# Patient Record
Sex: Female | Born: 1960
Health system: Southern US, Community
[De-identification: ages and names within clinical notes are randomized; demographics above are authoritative.]

## PROBLEM LIST (undated history)

## (undated) DIAGNOSIS — F419 Anxiety disorder, unspecified: Secondary | ICD-10-CM

## (undated) DIAGNOSIS — N939 Abnormal uterine and vaginal bleeding, unspecified: Secondary | ICD-10-CM

## (undated) DIAGNOSIS — M1990 Unspecified osteoarthritis, unspecified site: Secondary | ICD-10-CM

## (undated) DIAGNOSIS — Z973 Presence of spectacles and contact lenses: Secondary | ICD-10-CM

## (undated) HISTORY — PX: COLONOSCOPY: SHX174

## (undated) HISTORY — PX: BREAST BIOPSY: SHX20

## (undated) HISTORY — DX: Anxiety disorder, unspecified: F41.9

## (undated) HISTORY — DX: Unspecified osteoarthritis, unspecified site: M19.90

---

## 1999-08-06 ENCOUNTER — Other Ambulatory Visit: Admission: RE | Admit: 1999-08-06 | Discharge: 1999-08-06 | Payer: Self-pay | Admitting: Obstetrics & Gynecology

## 2000-03-22 ENCOUNTER — Encounter: Payer: Self-pay | Admitting: Gynecology

## 2000-03-22 ENCOUNTER — Encounter: Admission: RE | Admit: 2000-03-22 | Discharge: 2000-03-22 | Payer: Self-pay | Admitting: Gynecology

## 2000-08-29 ENCOUNTER — Other Ambulatory Visit: Admission: RE | Admit: 2000-08-29 | Discharge: 2000-08-29 | Payer: Self-pay | Admitting: Obstetrics & Gynecology

## 2002-01-08 ENCOUNTER — Encounter: Admission: RE | Admit: 2002-01-08 | Discharge: 2002-01-08 | Payer: Self-pay | Admitting: Gynecology

## 2002-01-08 ENCOUNTER — Encounter: Payer: Self-pay | Admitting: Gynecology

## 2002-06-26 ENCOUNTER — Other Ambulatory Visit: Admission: RE | Admit: 2002-06-26 | Discharge: 2002-06-26 | Payer: Self-pay | Admitting: Gynecology

## 2003-01-22 ENCOUNTER — Encounter: Payer: Self-pay | Admitting: Gynecology

## 2003-01-22 ENCOUNTER — Encounter: Admission: RE | Admit: 2003-01-22 | Discharge: 2003-01-22 | Payer: Self-pay | Admitting: Gynecology

## 2003-06-27 ENCOUNTER — Encounter: Payer: Self-pay | Admitting: Gynecology

## 2003-06-27 ENCOUNTER — Encounter: Admission: RE | Admit: 2003-06-27 | Discharge: 2003-06-27 | Payer: Self-pay | Admitting: Gynecology

## 2003-07-30 ENCOUNTER — Encounter: Admission: RE | Admit: 2003-07-30 | Discharge: 2003-07-30 | Payer: Self-pay | Admitting: Gynecology

## 2003-07-30 ENCOUNTER — Encounter: Payer: Self-pay | Admitting: Gynecology

## 2003-07-30 ENCOUNTER — Encounter (INDEPENDENT_AMBULATORY_CARE_PROVIDER_SITE_OTHER): Payer: Self-pay | Admitting: *Deleted

## 2003-12-03 ENCOUNTER — Other Ambulatory Visit: Admission: RE | Admit: 2003-12-03 | Discharge: 2003-12-03 | Payer: Self-pay | Admitting: Gynecology

## 2004-06-28 ENCOUNTER — Encounter: Admission: RE | Admit: 2004-06-28 | Discharge: 2004-06-28 | Payer: Self-pay | Admitting: Gynecology

## 2005-01-26 ENCOUNTER — Other Ambulatory Visit: Admission: RE | Admit: 2005-01-26 | Discharge: 2005-01-26 | Payer: Self-pay | Admitting: Gynecology

## 2005-08-02 ENCOUNTER — Encounter: Admission: RE | Admit: 2005-08-02 | Discharge: 2005-08-02 | Payer: Self-pay | Admitting: Gynecology

## 2005-11-21 ENCOUNTER — Encounter: Admission: RE | Admit: 2005-11-21 | Discharge: 2005-11-21 | Payer: Self-pay | Admitting: Gynecology

## 2006-02-01 ENCOUNTER — Other Ambulatory Visit: Admission: RE | Admit: 2006-02-01 | Discharge: 2006-02-01 | Payer: Self-pay | Admitting: Gynecology

## 2006-08-10 ENCOUNTER — Encounter: Admission: RE | Admit: 2006-08-10 | Discharge: 2006-08-10 | Payer: Self-pay | Admitting: Gynecology

## 2007-02-26 ENCOUNTER — Other Ambulatory Visit: Admission: RE | Admit: 2007-02-26 | Discharge: 2007-02-26 | Payer: Self-pay | Admitting: Gynecology

## 2007-08-14 ENCOUNTER — Encounter: Admission: RE | Admit: 2007-08-14 | Discharge: 2007-08-14 | Payer: Self-pay | Admitting: Gynecology

## 2007-08-16 ENCOUNTER — Encounter: Admission: RE | Admit: 2007-08-16 | Discharge: 2007-08-16 | Payer: Self-pay | Admitting: Gynecology

## 2008-08-14 ENCOUNTER — Encounter: Admission: RE | Admit: 2008-08-14 | Discharge: 2008-08-14 | Payer: Self-pay | Admitting: Gynecology

## 2009-08-17 ENCOUNTER — Encounter: Admission: RE | Admit: 2009-08-17 | Discharge: 2009-08-17 | Payer: Self-pay | Admitting: Gynecology

## 2009-08-20 ENCOUNTER — Encounter: Admission: RE | Admit: 2009-08-20 | Discharge: 2009-08-20 | Payer: Self-pay | Admitting: Gynecology

## 2010-08-26 ENCOUNTER — Encounter: Admission: RE | Admit: 2010-08-26 | Discharge: 2010-08-26 | Payer: Self-pay | Admitting: Internal Medicine

## 2011-01-09 ENCOUNTER — Encounter: Payer: Self-pay | Admitting: Gynecology

## 2011-01-10 ENCOUNTER — Encounter: Payer: Self-pay | Admitting: Gynecology

## 2011-07-27 ENCOUNTER — Other Ambulatory Visit: Payer: Self-pay | Admitting: Internal Medicine

## 2011-07-27 DIAGNOSIS — Z1231 Encounter for screening mammogram for malignant neoplasm of breast: Secondary | ICD-10-CM

## 2011-08-29 ENCOUNTER — Ambulatory Visit
Admission: RE | Admit: 2011-08-29 | Discharge: 2011-08-29 | Disposition: A | Discharge status: Home / Self Care | Payer: Commercial Managed Care - PPO | Source: Ambulatory Visit | Attending: Internal Medicine | Admitting: Internal Medicine

## 2011-08-29 DIAGNOSIS — Z1231 Encounter for screening mammogram for malignant neoplasm of breast: Secondary | ICD-10-CM

## 2011-09-01 ENCOUNTER — Other Ambulatory Visit: Payer: Self-pay | Admitting: Internal Medicine

## 2011-09-01 DIAGNOSIS — R928 Other abnormal and inconclusive findings on diagnostic imaging of breast: Secondary | ICD-10-CM

## 2011-09-05 ENCOUNTER — Ambulatory Visit
Admission: RE | Admit: 2011-09-05 | Discharge: 2011-09-05 | Disposition: A | Discharge status: Home / Self Care | Payer: Commercial Managed Care - PPO | Source: Ambulatory Visit | Attending: Internal Medicine | Admitting: Internal Medicine

## 2011-09-05 DIAGNOSIS — R928 Other abnormal and inconclusive findings on diagnostic imaging of breast: Secondary | ICD-10-CM

## 2012-08-01 ENCOUNTER — Other Ambulatory Visit: Payer: Self-pay | Admitting: Internal Medicine

## 2012-08-01 DIAGNOSIS — Z1231 Encounter for screening mammogram for malignant neoplasm of breast: Secondary | ICD-10-CM

## 2012-09-03 ENCOUNTER — Ambulatory Visit
Admission: RE | Admit: 2012-09-03 | Discharge: 2012-09-03 | Disposition: A | Discharge status: Home / Self Care | Payer: Commercial Managed Care - PPO | Source: Ambulatory Visit | Attending: Internal Medicine | Admitting: Internal Medicine

## 2012-09-03 DIAGNOSIS — Z1231 Encounter for screening mammogram for malignant neoplasm of breast: Secondary | ICD-10-CM

## 2012-11-22 ENCOUNTER — Encounter: Payer: Self-pay | Admitting: Internal Medicine

## 2013-08-07 ENCOUNTER — Other Ambulatory Visit: Payer: Self-pay

## 2013-08-07 DIAGNOSIS — Z1231 Encounter for screening mammogram for malignant neoplasm of breast: Secondary | ICD-10-CM

## 2013-09-05 ENCOUNTER — Ambulatory Visit
Admission: RE | Admit: 2013-09-05 | Discharge: 2013-09-05 | Disposition: A | Discharge status: Home / Self Care | Payer: Commercial Managed Care - PPO | Source: Ambulatory Visit

## 2013-09-05 DIAGNOSIS — Z1231 Encounter for screening mammogram for malignant neoplasm of breast: Secondary | ICD-10-CM

## 2014-07-23 ENCOUNTER — Other Ambulatory Visit: Payer: Self-pay

## 2014-07-23 DIAGNOSIS — Z1231 Encounter for screening mammogram for malignant neoplasm of breast: Secondary | ICD-10-CM

## 2014-09-08 ENCOUNTER — Ambulatory Visit
Admission: RE | Admit: 2014-09-08 | Discharge: 2014-09-08 | Disposition: A | Discharge status: Home / Self Care | Payer: 59 | Source: Ambulatory Visit

## 2014-09-08 DIAGNOSIS — Z1231 Encounter for screening mammogram for malignant neoplasm of breast: Secondary | ICD-10-CM

## 2014-09-10 ENCOUNTER — Encounter (HOSPITAL_BASED_OUTPATIENT_CLINIC_OR_DEPARTMENT_OTHER): Payer: Self-pay | Admitting: *Deleted

## 2014-09-10 NOTE — Progress Notes (Signed)
NPO AFTER MN. ARRIVE AT 0600. NEEDS HG.  PRE-ORDERS PENDING.

## 2014-09-12 NOTE — H&P (Signed)
Margaret Hoffman is an 53 y.o. G 2 P 2 with postmenopausal bleeding. Ultrasound in office 17 mm.  Pertinent Gynecological History: Menses: post-menopausal Bleeding: post menopausal bleeding Contraception: none DES exposure: denies Blood transfusions: none Sexually transmitted diseases: no past history Previous GYN Procedures: none  Last mammogram: normal Date: 2015 Last pap: normal Date: 2015 OB History: G2, P2   Menstrual History: Menarche age: unknown No LMP recorded. Patient is postmenopausal.    Past Medical History  Diagnosis Date  . Endometrial polyp   . Wears contact lenses     History reviewed. No pertinent past surgical history.  History reviewed. No pertinent family history.  Social History:  reports that she has never smoked. She has never used smokeless tobacco. She reports that she drinks alcohol. She reports that she does not use illicit drugs.  Allergies:  Allergies  Allergen Reactions  . Codeine Nausea And Vomiting    No prescriptions prior to admission    ROS  Height 5' 6.75" (1.695 m), weight 62.596 kg (138 lb). Physical Exam  General alert and oriented Lung CTAB Car RRR Abdomen is soft and non tender Pelvic is WNL  Assessment/Plan: Postmenopausal bleeding Thickened Endometrium D and C, Hysteroscopy Risks reviewed with patient Consent signed  Peace Jost L 09/12/2014, 7:33 AM

## 2014-09-14 NOTE — Anesthesia Preprocedure Evaluation (Addendum)
Anesthesia Evaluation  Patient identified by MRN, date of birth, ID band Patient awake    Reviewed: Allergy & Precautions, H&P , NPO status , Patient's Chart, lab work & pertinent test results  Airway Mallampati: II TM Distance: >3 FB Neck ROM: Full    Dental no notable dental hx. (+) Teeth Intact, Dental Advisory Given   Pulmonary neg pulmonary ROS,  breath sounds clear to auscultation  Pulmonary exam normal       Cardiovascular negative cardio ROS  Rhythm:Regular Rate:Normal     Neuro/Psych negative neurological ROS  negative psych ROS   GI/Hepatic negative GI ROS, Neg liver ROS,   Endo/Other  negative endocrine ROS  Renal/GU negative Renal ROS     Musculoskeletal negative musculoskeletal ROS (+)   Abdominal   Peds  Hematology negative hematology ROS (+)   Anesthesia Other Findings   Reproductive/Obstetrics negative OB ROS                         Anesthesia Physical Anesthesia Plan  ASA: II  Anesthesia Plan: General   Post-op Pain Management:    Induction: Intravenous  Airway Management Planned: LMA  Additional Equipment:   Intra-op Plan:   Post-operative Plan: Extubation in OR  Informed Consent: I have reviewed the patients History and Physical, chart, labs and discussed the procedure including the risks, benefits and alternatives for the proposed anesthesia with the patient or authorized representative who has indicated his/her understanding and acceptance.   Dental advisory given  Plan Discussed with: CRNA  Anesthesia Plan Comments:         Anesthesia Quick Evaluation

## 2014-09-15 ENCOUNTER — Ambulatory Visit (HOSPITAL_BASED_OUTPATIENT_CLINIC_OR_DEPARTMENT_OTHER)
Admission: RE | Admit: 2014-09-15 | Discharge: 2014-09-15 | Disposition: A | Discharge status: Home / Self Care | Payer: 59 | Source: Ambulatory Visit | Attending: Obstetrics and Gynecology | Admitting: Obstetrics and Gynecology

## 2014-09-15 ENCOUNTER — Encounter (HOSPITAL_BASED_OUTPATIENT_CLINIC_OR_DEPARTMENT_OTHER)
Admission: RE | Disposition: A | Discharge status: Home / Self Care | Payer: Self-pay | Source: Ambulatory Visit | Attending: Obstetrics and Gynecology

## 2014-09-15 ENCOUNTER — Encounter (HOSPITAL_BASED_OUTPATIENT_CLINIC_OR_DEPARTMENT_OTHER): Payer: 59 | Admitting: Anesthesiology

## 2014-09-15 ENCOUNTER — Encounter (HOSPITAL_BASED_OUTPATIENT_CLINIC_OR_DEPARTMENT_OTHER): Payer: Self-pay | Admitting: *Deleted

## 2014-09-15 ENCOUNTER — Ambulatory Visit (HOSPITAL_BASED_OUTPATIENT_CLINIC_OR_DEPARTMENT_OTHER): Payer: 59 | Admitting: Anesthesiology

## 2014-09-15 DIAGNOSIS — N840 Polyp of corpus uteri: Secondary | ICD-10-CM | POA: Diagnosis not present

## 2014-09-15 DIAGNOSIS — Z885 Allergy status to narcotic agent status: Secondary | ICD-10-CM | POA: Insufficient documentation

## 2014-09-15 DIAGNOSIS — N950 Postmenopausal bleeding: Secondary | ICD-10-CM | POA: Diagnosis not present

## 2014-09-15 DIAGNOSIS — R9389 Abnormal findings on diagnostic imaging of other specified body structures: Secondary | ICD-10-CM | POA: Diagnosis not present

## 2014-09-15 HISTORY — DX: Presence of spectacles and contact lenses: Z97.3

## 2014-09-15 HISTORY — PX: HYSTEROSCOPY WITH D & C: SHX1775

## 2014-09-15 LAB — CBC
HCT: 38.4 % (ref 36.0–46.0)
HEMOGLOBIN: 13 g/dL (ref 12.0–15.0)
MCH: 28.8 pg (ref 26.0–34.0)
MCHC: 33.9 g/dL (ref 30.0–36.0)
MCV: 85 fL (ref 78.0–100.0)
Platelets: 198 10*3/uL (ref 150–400)
RBC: 4.52 MIL/uL (ref 3.87–5.11)
RDW: 12.6 % (ref 11.5–15.5)
WBC: 5 10*3/uL (ref 4.0–10.5)

## 2014-09-15 SURGERY — DILATATION AND CURETTAGE /HYSTEROSCOPY
Anesthesia: Monitor Anesthesia Care | Site: Vagina

## 2014-09-15 MED ORDER — MEPERIDINE HCL 25 MG/ML IJ SOLN
6.2500 mg | INTRAMUSCULAR | Status: DC | PRN
  Filled 2014-09-15: qty 1

## 2014-09-15 MED ORDER — CEFAZOLIN SODIUM-DEXTROSE 2-3 GM-% IV SOLR
INTRAVENOUS | Status: AC
  Filled 2014-09-15: qty 50

## 2014-09-15 MED ORDER — PROPOFOL INFUSION 10 MG/ML OPTIME
INTRAVENOUS | Status: DC | PRN
  Administered 2014-09-15: 30 mL via INTRAVENOUS

## 2014-09-15 MED ORDER — LACTATED RINGERS IV SOLN
INTRAVENOUS | Status: DC
  Administered 2014-09-15 (×2): via INTRAVENOUS
  Filled 2014-09-15: qty 1000

## 2014-09-15 MED ORDER — ONDANSETRON HCL 4 MG/2ML IJ SOLN
INTRAMUSCULAR | Status: DC | PRN
  Administered 2014-09-15: 4 mg via INTRAVENOUS

## 2014-09-15 MED ORDER — KETOROLAC TROMETHAMINE 30 MG/ML IJ SOLN
INTRAMUSCULAR | Status: DC | PRN
  Administered 2014-09-15: 30 mg via INTRAVENOUS

## 2014-09-15 MED ORDER — GLYCINE 1.5 % IR SOLN
Status: DC | PRN
  Administered 2014-09-15: 3000 mL

## 2014-09-15 MED ORDER — PROMETHAZINE HCL 25 MG/ML IJ SOLN
6.2500 mg | INTRAMUSCULAR | Status: DC | PRN
  Filled 2014-09-15: qty 1

## 2014-09-15 MED ORDER — FENTANYL CITRATE 0.05 MG/ML IJ SOLN
INTRAMUSCULAR | Status: AC
  Filled 2014-09-15: qty 2

## 2014-09-15 MED ORDER — OXYCODONE HCL 5 MG/5ML PO SOLN
5.0000 mg | Freq: Once | ORAL | Status: DC | PRN
  Filled 2014-09-15: qty 5

## 2014-09-15 MED ORDER — LIDOCAINE HCL (CARDIAC) 20 MG/ML IV SOLN
INTRAVENOUS | Status: DC | PRN
  Administered 2014-09-15: 50 mg via INTRAVENOUS

## 2014-09-15 MED ORDER — IBUPROFEN 200 MG PO TABS: 600.0000 mg | ORAL_TABLET | Freq: Four times a day (QID) | ORAL | Status: DC | PRN

## 2014-09-15 MED ORDER — HYDROMORPHONE HCL 1 MG/ML IJ SOLN
0.2500 mg | INTRAMUSCULAR | Status: DC | PRN
  Filled 2014-09-15: qty 1

## 2014-09-15 MED ORDER — PROPOFOL INFUSION 10 MG/ML OPTIME
INTRAVENOUS | Status: DC | PRN
  Administered 2014-09-15: 140 ug/kg/min via INTRAVENOUS

## 2014-09-15 MED ORDER — MIDAZOLAM HCL 5 MG/5ML IJ SOLN
INTRAMUSCULAR | Status: DC | PRN
  Administered 2014-09-15: 2 mg via INTRAVENOUS

## 2014-09-15 MED ORDER — MIDAZOLAM HCL 2 MG/2ML IJ SOLN
INTRAMUSCULAR | Status: AC
  Filled 2014-09-15: qty 2

## 2014-09-15 MED ORDER — DEXAMETHASONE SODIUM PHOSPHATE 10 MG/ML IJ SOLN
INTRAMUSCULAR | Status: DC | PRN
  Administered 2014-09-15: 10 mg via INTRAVENOUS

## 2014-09-15 MED ORDER — OXYCODONE HCL 5 MG PO TABS
5.0000 mg | ORAL_TABLET | Freq: Once | ORAL | Status: DC | PRN
Start: 2014-09-15 — End: 2014-09-15
  Filled 2014-09-15: qty 1

## 2014-09-15 MED ORDER — CEFAZOLIN SODIUM-DEXTROSE 2-3 GM-% IV SOLR
2.0000 g | INTRAVENOUS | Status: AC
  Administered 2014-09-15: 2 g via INTRAVENOUS
  Filled 2014-09-15: qty 50

## 2014-09-15 MED ORDER — LACTATED RINGERS IV SOLN
INTRAVENOUS | Status: DC
  Filled 2014-09-15: qty 1000

## 2014-09-15 MED ORDER — LIDOCAINE HCL 1 % IJ SOLN
INTRAMUSCULAR | Status: DC | PRN
  Administered 2014-09-15: 20 mL

## 2014-09-15 MED ORDER — FENTANYL CITRATE 0.05 MG/ML IJ SOLN
INTRAMUSCULAR | Status: DC | PRN
  Administered 2014-09-15: 50 ug via INTRAVENOUS

## 2014-09-15 SURGICAL SUPPLY — 24 items
CANISTER SUCTION 2500CC (MISCELLANEOUS) ×2 IMPLANT
CATH ROBINSON RED A/P 16FR (CATHETERS) ×2 IMPLANT
CLOTH BEACON ORANGE TIMEOUT ST (SAFETY) ×2 IMPLANT
COVER TABLE BACK 60X90 (DRAPES) ×2 IMPLANT
DRAPE CAMERA CLOSED 9X96 (DRAPES) ×2 IMPLANT
DRAPE HYSTEROSCOPY (DRAPE) ×2 IMPLANT
DRAPE LG THREE QUARTER DISP (DRAPES) ×2 IMPLANT
DRESSING TELFA 8X3 (GAUZE/BANDAGES/DRESSINGS) ×2 IMPLANT
ELECT REM PT RETURN 9FT ADLT (ELECTROSURGICAL)
ELECTRODE REM PT RTRN 9FT ADLT (ELECTROSURGICAL) IMPLANT
GLOVE BIO SURGEON STRL SZ 6.5 (GLOVE) ×4 IMPLANT
GLOVE BIO SURGEON STRL SZ7 (GLOVE) ×4 IMPLANT
GOWN PREVENTION PLUS LG XLONG (DISPOSABLE) ×2 IMPLANT
GOWN STRL REIN XL XLG (GOWN DISPOSABLE) IMPLANT
GOWN STRL REUS W/TWL LRG LVL3 (GOWN DISPOSABLE) ×2 IMPLANT
LEGGING LITHOTOMY PAIR STRL (DRAPES) ×2 IMPLANT
LOOP ANGLED CUTTING 22FR (CUTTING LOOP) IMPLANT
NEEDLE SPNL 22GX3.5 QUINCKE BK (NEEDLE) ×2 IMPLANT
SET TUBING HYSTEROSCOPY 2 NDL (TUBING) ×2 IMPLANT
SYR CONTROL 10ML LL (SYRINGE) ×2 IMPLANT
TOWEL OR 17X24 6PK STRL BLUE (TOWEL DISPOSABLE) ×4 IMPLANT
TRAY DSU PREP LF (CUSTOM PROCEDURE TRAY) ×2 IMPLANT
TUBE HYSTEROSCOPY W Y-CONNECT (TUBING) ×2 IMPLANT
WATER STERILE IRR 500ML POUR (IV SOLUTION) ×2 IMPLANT

## 2014-09-15 NOTE — Anesthesia Postprocedure Evaluation (Signed)
Anesthesia Post Note  Patient: Margaret Hoffman  Procedure(s) Performed: Procedure(s) (LRB): DILATATION AND CURETTAGE /HYSTEROSCOPY (N/A)  Anesthesia type: MAC  Patient location: PACU  Post pain: Pain level controlled  Post assessment: Post-op Vital signs reviewed  Last Vitals: BP 115/63  Pulse 57  Temp(Src) 36.7 C (Oral)  Resp 16  Ht 5' 6.75" (1.695 m)  Wt 140 lb 8 oz (63.73 kg)  BMI 22.18 kg/m2  SpO2 100%  Post vital signs: Reviewed  Level of consciousness: awake  Complications: No apparent anesthesia complications

## 2014-09-15 NOTE — Transfer of Care (Signed)
Immediate Anesthesia Transfer of Care Note  Patient: Margaret Hoffman  Procedure(s) Performed: Procedure(s): DILATATION AND CURETTAGE /HYSTEROSCOPY (N/A)  Patient Location: PACU  Anesthesia Type:MAC  Level of Consciousness: awake, alert , oriented and patient cooperative  Airway & Oxygen Therapy: Patient Spontanous Breathing and Patient connected to nasal cannula oxygen  Post-op Assessment: Report given to PACU RN and Post -op Vital signs reviewed and stable  Post vital signs: Reviewed and stable  Complications: No apparent anesthesia complications

## 2014-09-15 NOTE — Progress Notes (Signed)
H and P on the chart No significant changes Will proceed with D and C, Hysteroscopy Consent signed 

## 2014-09-15 NOTE — Anesthesia Procedure Notes (Signed)
Procedure Name: MAC Date/Time: 09/15/2014 7:38 AM Performed by: Wanita Chamberlain Pre-anesthesia Checklist: Patient identified, Timeout performed, Emergency Drugs available, Suction available and Patient being monitored Patient Re-evaluated:Patient Re-evaluated prior to inductionOxygen Delivery Method: Nasal cannula Intubation Type: IV induction Placement Confirmation: positive ETCO2 Dental Injury: Teeth and Oropharynx as per pre-operative assessment

## 2014-09-15 NOTE — Discharge Instructions (Signed)
D & C Home care Instructions:   Personal hygiene:  Used sanitary napkins for vaginal drainage not tampons. Shower or tub bathe the day after your procedure. No douching until bleeding stops. Always wipe from front to back after  Elimination.  Activity: Do not drive or operate any equipment today. The effects of the anesthesia are still present and drowsiness may result. Rest today, not necessarily flat bed rest, just take it easy. You may resume your normal activity in one to 2 days.  Sexual activity: No intercourse for one week or as indicated by your physician  Diet: Eat a light diet as desired this evening. You may resume a regular diet tomorrow.  Return to work: One to 2 days.  General Expectations of your surgery: Vaginal bleeding should be no heavier than a normal period. Spotting may continue up to 10 days. Mild cramps may continue for a couple of days. You may have a regular period in 2-6 weeks.  Unexpected observations call your doctor if these occur: persistent or heavy bleeding. Severe abdominal cramping or pain. Elevation of temperature greater than 100F.  Call for an appointment in one week.    Patient's Signature_______________________________________________________  Nurse's Signature________________________________________________________    Post Anesthesia Home Care Instructions  Activity: Get plenty of rest for the remainder of the day. A responsible adult should stay with you for 24 hours following the procedure.  For the next 24 hours, DO NOT: -Drive a car -Paediatric nurse -Drink alcoholic beverages -Take any medication unless instructed by your physician -Make any legal decisions or sign important papers.  Meals: Start with liquid foods such as gelatin or soup. Progress to regular foods as tolerated. Avoid greasy, spicy, heavy foods. If nausea and/or vomiting occur, drink only clear liquids until the nausea and/or vomiting subsides. Call your  physician if vomiting continues.  Special Instructions/Symptoms: Your throat may feel dry or sore from the anesthesia or the breathing tube placed in your throat during surgery. If this causes discomfort, gargle with warm salt water. The discomfort should disappear within 24 hours. Hysteroscopy, Care After Refer to this sheet in the next few weeks. These instructions provide you with information on caring for yourself after your procedure. Your health care provider may also give you more specific instructions. Your treatment has been planned according to current medical practices, but problems sometimes occur. Call your health care provider if you have any problems or questions after your procedure.  WHAT TO EXPECT AFTER THE PROCEDURE After your procedure, it is typical to have the following:  You may have some cramping. This normally lasts for a couple days.  You may have bleeding. This can vary from light spotting for a few days to menstrual-like bleeding for 3-7 days. HOME CARE INSTRUCTIONS  Rest for the first 1-2 days after the procedure.  Only take over-the-counter or prescription medicines as directed by your health care provider. Do not take aspirin. It can increase the chances of bleeding.  Take showers instead of baths for 2 weeks or as directed by your health care provider.  Do not drive for 24 hours or as directed.  Do not drink alcohol while taking pain medicine.  Do not use tampons, douche, or have sexual intercourse for 2 weeks or until your health care provider says it is okay.  Take your temperature twice a day for 4-5 days. Write it down each time.  Follow your health care provider's advice about diet, exercise, and lifting.  If you develop constipation,  you may:  Take a mild laxative if your health care provider approves.  Add bran foods to your diet.  Drink enough fluids to keep your urine clear or pale yellow.  Try to have someone with you or available to you  for the first 24-48 hours, especially if you were given a general anesthetic.  Follow up with your health care provider as directed. SEEK MEDICAL CARE IF:  You feel dizzy or lightheaded.  You feel sick to your stomach (nauseous).  You have abnormal vaginal discharge.  You have a rash.  You have pain that is not controlled with medicine. SEEK IMMEDIATE MEDICAL CARE IF:  You have bleeding that is heavier than a normal menstrual period.  You have a fever.  You have increasing cramps or pain, not controlled with medicine.  You have new belly (abdominal) pain.  You pass out.  You have pain in the tops of your shoulders (shoulder strap areas).  You have shortness of breath. Document Released: 09/25/2013 Document Reviewed: 09/25/2013 [facility] Patient Information 2015 [place], [place]. This information is not intended to replace advice given to you by your health care provider. Make sure you discuss any questions you have with your health care provider.

## 2014-09-15 NOTE — Consult Note (Deleted)
NAMEKAREY, SUTHERS                 ACCOUNT NO.:  [id number]  MEDICAL RECORD NO.:  [id number]  LOCATION:                                 FACILITY:  PHYSICIAN:  Anshu Wehner L. Laurita Peron, M.D.DATE OF BIRTH:  18-Apr-1961  DATE OF CONSULTATION:  09/15/2014 DATE OF DISCHARGE:  09/15/2014                                CONSULTATION   PREOPERATIVE DIAGNOSIS:  Postmenopausal bleeding and endometrial polyps.  POSTOPERATIVE DIAGNOSIS:  Postmenopausal bleeding and endometrial polyps.  PROCEDURE:  Dilation and curettage, hysteroscopy, and removal of endometrial polyp.  SURGEON:  Labaron Digirolamo L. Pleasant Bensinger, MD  ANESTHESIA:  Paracervical block with IV sedation.  EBL:  Minimal.  COMPLICATIONS:  None.  DRAINS:  None.  PATHOLOGY:  Uterine curettings.  DESCRIPTION OF PROCEDURE:  The patient was taken to the operating room. She was administered anesthesia.  She was prepped and draped.  Time-out was performed.  A speculum was inserted in the vagina.  The cervix was grasped with a tenaculum and a paracervical block was performed in the standard fashion.  I tried to dilate the cervix with [facility] dilators. Her cervical internal os was stenotic, so I gently inserted the diagnostic hysteroscope and was able to easily enter the uterine cavity. With excellent visualization, I could see that for the most part the uterine cavity was very atrophic.  Both tubal ostia were seen.  However, there were some polypoid areas and a cluster of polyps on the right wall of the uterus going down to the internal os.  I then removed the hysteroscope, inserted a sharp curette and curetted the uterus, and then inserted the polyp forceps a few times to remove polypoid tissue.  A final curettage was then performed.  The hysteroscope was reinserted, and the uterine cavity was cleaned.  All sponge, lap, and instrument counts were correct x2.  The patient went to recovery room in stable condition.     Manali Mcelmurry L. Helane Rima,  M.D.    Nevin Bloodgood  D:  09/15/2014  T:  09/15/2014  Job:  [id number]

## 2014-09-15 NOTE — Brief Op Note (Signed)
09/15/2014  8:10 AM  PATIENT:  Louie Bun  53 y.o. female  PRE-OPERATIVE DIAGNOSIS:  Postmenopausal bleeding, endometrial polyps  POST-OPERATIVE DIAGNOSIS:  Same  PROCEDURE:  Procedure(s): DILATATION AND CURETTAGE /HYSTEROSCOPY (N/A)  SURGEON:  Surgeon(s) and Role:    * Cyril Mourning, MD - Primary  PHYSICIAN ASSISTANT:   ASSISTANTS: none   ANESTHESIA:   IV sedation and paracervical block  EBL:  Total I/O In: -  Out: 100 [Urine:100]  BLOOD ADMINISTERED:none  DRAINS: none   LOCAL MEDICATIONS USED:  LIDOCAINE   SPECIMEN:  Source of Specimen:  uterine curretings  DISPOSITION OF SPECIMEN:  PATHOLOGY  COUNTS:  YES  TOURNIQUET:  * No tourniquets in log *  DICTATION: .Other Dictation: Dictation Number [id number]  PLAN OF CARE: Discharge to home after PACU  PATIENT DISPOSITION:  PACU - hemodynamically stable.   Delay start of Pharmacological VTE agent (>24hrs) due to surgical blood loss or risk of bleeding: not applicable

## 2014-09-16 ENCOUNTER — Encounter (HOSPITAL_BASED_OUTPATIENT_CLINIC_OR_DEPARTMENT_OTHER): Payer: Self-pay | Admitting: Obstetrics and Gynecology

## 2014-09-25 NOTE — Consult Note (Signed)
NAMEFAUSTINA, Margaret Hoffman                 ACCOUNT NO.:  [id number]  MEDICAL RECORD NO.:  [id number]  LOCATION:                                 FACILITY:  PHYSICIAN:  Corynn Solberg L. Jamarius Saha, M.D.DATE OF BIRTH:  30-Sep-1961  DATE OF PROCEDURE:  09/15/2014 DATE OF DISCHARGE:  09/15/2014                              OPERATIVE REPORT   PREOPERATIVE DIAGNOSIS:  Postmenopausal bleeding and endometrial polyps.  POSTOPERATIVE DIAGNOSIS:  Postmenopausal bleeding and endometrial polyps.  PROCEDURE:  Dilation and curettage, hysteroscopy, and removal of endometrial polyp.  SURGEON:  Shuntae Herzig L. Olon Russ, MD  ANESTHESIA:  Paracervical block with IV sedation.  EBL:  Minimal.  COMPLICATIONS:  None.  DRAINS:  None.  PATHOLOGY:  Uterine curettings.  DESCRIPTION OF PROCEDURE:  The patient was taken to the operating room. She was administered anesthesia.  She was prepped and draped.  Time-out was performed.  A speculum was inserted in the vagina.  The cervix was grasped with a tenaculum and a paracervical block was performed in the standard fashion.  I tried to dilate the cervix with [facility] dilators. Her cervical internal os was stenotic, so I gently inserted the diagnostic hysteroscope and was able to easily enter the uterine cavity. With excellent visualization, I could see that for the most part the uterine cavity was very atrophic.  Both tubal ostia were seen.  However, there were some polypoid areas and a cluster of polyps on the right wall of the uterus going down to the internal os.  I then removed the hysteroscope, inserted a sharp curette and curetted the uterus, and then inserted the polyp forceps a few times to remove polypoid tissue.  A final curettage was then performed.  The hysteroscope was reinserted, and the uterine cavity was cleaned.  All sponge, lap, and instrument counts were correct x2.  The patient went to recovery room in stable condition.     Silvanna Ohmer L. Helane Rima,  M.D.    Nevin Bloodgood  D:  09/15/2014  T:  09/15/2014  Job:  [id number]

## 2015-08-11 ENCOUNTER — Other Ambulatory Visit: Payer: Self-pay

## 2015-08-11 DIAGNOSIS — Z1231 Encounter for screening mammogram for malignant neoplasm of breast: Secondary | ICD-10-CM

## 2015-09-18 ENCOUNTER — Ambulatory Visit
Admission: RE | Admit: 2015-09-18 | Discharge: 2015-09-18 | Disposition: A | Discharge status: Home / Self Care | Payer: 59 | Source: Ambulatory Visit

## 2015-09-18 DIAGNOSIS — Z1231 Encounter for screening mammogram for malignant neoplasm of breast: Secondary | ICD-10-CM

## 2015-09-22 ENCOUNTER — Encounter: Payer: Self-pay | Admitting: *Deleted

## 2016-01-08 MED FILL — SERTRALINE HCL 50 MG TABLET: 50 | 30 days supply | Qty: 30 | Fill #1

## 2016-01-08 MED FILL — ALPRAZolam 0.5 MG TABS: 0.5 | 30 days supply | Qty: 30 | Fill #1

## 2016-01-26 DIAGNOSIS — N950 Postmenopausal bleeding: Secondary | ICD-10-CM | POA: Diagnosis not present

## 2016-01-26 DIAGNOSIS — D251 Intramural leiomyoma of uterus: Secondary | ICD-10-CM | POA: Diagnosis not present

## 2016-02-01 MED FILL — PROGESTERONE 200 MG CAPSULE: 200 | 30 days supply | Qty: 12 | Fill #4

## 2016-02-01 MED FILL — ELESTRIN 0.06% GEL: 0.52 MG/0.8 | 30 days supply | Qty: 52 | Fill #8

## 2016-02-01 MED FILL — FLUCONAZOLE 150 MG TABLET: 150 | 1 days supply | Qty: 1 | Fill #0

## 2016-02-05 ENCOUNTER — Encounter (HOSPITAL_BASED_OUTPATIENT_CLINIC_OR_DEPARTMENT_OTHER): Payer: Self-pay | Admitting: *Deleted

## 2016-02-08 ENCOUNTER — Encounter (HOSPITAL_BASED_OUTPATIENT_CLINIC_OR_DEPARTMENT_OTHER): Payer: Self-pay | Admitting: *Deleted

## 2016-02-08 NOTE — Progress Notes (Signed)
NPO AFTER MN.  ARRIVE AT 0600.  NEEDS HG.  

## 2016-02-09 MED FILL — FLUCONAZOLE 150 MG TABLET: 150 | 1 days supply | Qty: 1 | Fill #1

## 2016-02-09 MED FILL — ALPRAZolam 0.5 MG TABS: 0.5 | 30 days supply | Qty: 30 | Fill #2

## 2016-02-09 MED FILL — SERTRALINE HCL 50 MG TABLET: 50 | 30 days supply | Qty: 30 | Fill #2

## 2016-02-10 NOTE — H&P (Signed)
  55 year old G 2 P 2 with post menopausal bleeding. On HRT - Vivelle and Prometrium. Ultrasound in office - 10 mm endometrial stripe.  Past Medical History  Diagnosis Date  . Wears contact lenses   . Abnormal uterine bleeding (AUB)    Past Surgical History  Procedure Laterality Date  . Hysteroscopy w/d&c N/A 09/15/2014    Procedure: DILATATION AND CURETTAGE /HYSTEROSCOPY;  Surgeon: Cyril Mourning, MD;  Location: [facility];  Service: Gynecology;  Laterality: N/A;   endometrial polyp   Prior to Admission medications   Medication Sig Start Date End Date Taking? Authorizing Provider  ALPRAZolam Duanne Moron) 0.5 MG tablet Take 0.25 mg by mouth at bedtime as needed for anxiety.   Yes Historical Provider, MD  Estradiol (ELESTRIN) 0.52 MG/0.87 GM (0.06%) GEL Apply 2 application topically daily.   Yes Historical Provider, MD  ibuprofen (ADVIL) 200 MG tablet Take 3 tablets (600 mg total) by mouth every 6 (six) hours as needed. 09/15/14  Yes Dian Queen, MD  progesterone (PROMETRIUM) 200 MG capsule Take 200 mg by mouth as directed. Qd  Day 1 to 12 of every 3rd month-  Last dose aug 2015   Yes Historical Provider, MD  sertraline (ZOLOFT) 50 MG tablet Take 50 mg by mouth at bedtime.   Yes Historical Provider, MD   ROS : Negative  History reviewed. No pertinent family history. Social History   Social History  . Marital Status: Married    Spouse Name: N/A  . Number of Children: N/A  . Years of Education: N/A   Social History Main Topics  . Smoking status: Never Smoker   . Smokeless tobacco: Never Used  . Alcohol Use: Yes     Comment: rare  . Drug Use: No  . Sexual Activity: Not Asked   Other Topics Concern  . None   Social History Narrative   Ht 5' 6.75" (1.695 m)  Wt 63.504 kg (140 lb)  BMI 22.10 kg/m2 Car RRR Lung CTAB Car RRR Abdomen is soft and non tender Pelvic - above  IMPRESSION: Post menopausal bleeding Thickened endometrial stripe  PLAN: D and  C Hysteroscopy HTA Consent signed

## 2016-02-11 ENCOUNTER — Encounter (HOSPITAL_BASED_OUTPATIENT_CLINIC_OR_DEPARTMENT_OTHER)
Admission: RE | Disposition: A | Discharge status: Home / Self Care | Payer: Self-pay | Source: Ambulatory Visit | Attending: Obstetrics and Gynecology

## 2016-02-11 ENCOUNTER — Encounter (HOSPITAL_BASED_OUTPATIENT_CLINIC_OR_DEPARTMENT_OTHER): Payer: Self-pay | Admitting: *Deleted

## 2016-02-11 ENCOUNTER — Ambulatory Visit (HOSPITAL_BASED_OUTPATIENT_CLINIC_OR_DEPARTMENT_OTHER): Payer: 59 | Admitting: Anesthesiology

## 2016-02-11 ENCOUNTER — Ambulatory Visit (HOSPITAL_BASED_OUTPATIENT_CLINIC_OR_DEPARTMENT_OTHER)
Admission: RE | Admit: 2016-02-11 | Discharge: 2016-02-11 | Disposition: A | Discharge status: Home / Self Care | Payer: 59 | Source: Ambulatory Visit | Attending: Obstetrics and Gynecology | Admitting: Obstetrics and Gynecology

## 2016-02-11 DIAGNOSIS — N882 Stricture and stenosis of cervix uteri: Secondary | ICD-10-CM | POA: Insufficient documentation

## 2016-02-11 DIAGNOSIS — Z79899 Other long term (current) drug therapy: Secondary | ICD-10-CM | POA: Insufficient documentation

## 2016-02-11 DIAGNOSIS — N950 Postmenopausal bleeding: Secondary | ICD-10-CM | POA: Insufficient documentation

## 2016-02-11 DIAGNOSIS — N8501 Benign endometrial hyperplasia: Secondary | ICD-10-CM | POA: Insufficient documentation

## 2016-02-11 DIAGNOSIS — N8500 Endometrial hyperplasia, unspecified: Secondary | ICD-10-CM | POA: Diagnosis not present

## 2016-02-11 DIAGNOSIS — Z791 Long term (current) use of non-steroidal anti-inflammatories (NSAID): Secondary | ICD-10-CM | POA: Diagnosis not present

## 2016-02-11 DIAGNOSIS — R938 Abnormal findings on diagnostic imaging of other specified body structures: Secondary | ICD-10-CM | POA: Diagnosis not present

## 2016-02-11 HISTORY — DX: Abnormal uterine and vaginal bleeding, unspecified: N93.9

## 2016-02-11 HISTORY — PX: DILITATION & CURRETTAGE/HYSTROSCOPY WITH HYDROTHERMAL ABLATION: SHX5570

## 2016-02-11 LAB — POCT HEMOGLOBIN-HEMACUE: HEMOGLOBIN: 12.6 g/dL (ref 12.0–15.0)

## 2016-02-11 SURGERY — DILATATION & CURETTAGE/HYSTEROSCOPY WITH HYDROTHERMAL ABLATION
Anesthesia: Monitor Anesthesia Care | Site: Uterus

## 2016-02-11 MED ORDER — FENTANYL CITRATE (PF) 100 MCG/2ML IJ SOLN
INTRAMUSCULAR | Status: DC | PRN
  Administered 2016-02-11: 25 ug via INTRAVENOUS
  Administered 2016-02-11: 50 ug via INTRAVENOUS

## 2016-02-11 MED ORDER — OXYCODONE HCL 5 MG PO TABS
5.0000 mg | ORAL_TABLET | Freq: Once | ORAL | Status: DC | PRN
  Filled 2016-02-11: qty 1

## 2016-02-11 MED ORDER — KETOROLAC TROMETHAMINE 30 MG/ML IJ SOLN
INTRAMUSCULAR | Status: DC | PRN
  Administered 2016-02-11: 30 mg via INTRAVENOUS

## 2016-02-11 MED ORDER — LIDOCAINE HCL (CARDIAC) 20 MG/ML IV SOLN
INTRAVENOUS | Status: DC | PRN
  Administered 2016-02-11: 50 mg via INTRAVENOUS

## 2016-02-11 MED ORDER — CEFAZOLIN SODIUM-DEXTROSE 2-3 GM-% IV SOLR
2.0000 g | INTRAVENOUS | Status: AC
Start: 2016-02-11 — End: 2016-02-11
  Administered 2016-02-11: 2 g via INTRAVENOUS
  Filled 2016-02-11: qty 50

## 2016-02-11 MED ORDER — LIDOCAINE HCL 1 % IJ SOLN
INTRAMUSCULAR | Status: DC | PRN
  Administered 2016-02-11: 10 mL

## 2016-02-11 MED ORDER — LACTATED RINGERS IV SOLN
INTRAVENOUS | Status: DC
  Administered 2016-02-11: 07:00:00 via INTRAVENOUS
  Filled 2016-02-11: qty 1000

## 2016-02-11 MED ORDER — DEXAMETHASONE SODIUM PHOSPHATE 4 MG/ML IJ SOLN
INTRAMUSCULAR | Status: DC | PRN
  Administered 2016-02-11: 10 mg via INTRAVENOUS

## 2016-02-11 MED ORDER — SODIUM CHLORIDE 0.9 % IR SOLN
Status: DC | PRN
  Administered 2016-02-11: 3000 mL

## 2016-02-11 MED ORDER — PROPOFOL 500 MG/50ML IV EMUL
INTRAVENOUS | Status: AC
  Filled 2016-02-11: qty 50

## 2016-02-11 MED ORDER — WHITE PETROLATUM GEL
Status: AC
  Filled 2016-02-11: qty 5

## 2016-02-11 MED ORDER — PROPOFOL 500 MG/50ML IV EMUL
INTRAVENOUS | Status: DC | PRN
  Administered 2016-02-11: 100 ug/kg/min via INTRAVENOUS

## 2016-02-11 MED ORDER — ONDANSETRON HCL 4 MG/2ML IJ SOLN
4.0000 mg | Freq: Four times a day (QID) | INTRAMUSCULAR | Status: DC | PRN
  Filled 2016-02-11: qty 2

## 2016-02-11 MED ORDER — ONDANSETRON HCL 4 MG/2ML IJ SOLN
INTRAMUSCULAR | Status: DC | PRN
  Administered 2016-02-11: 4 mg via INTRAVENOUS

## 2016-02-11 MED ORDER — OXYCODONE HCL 5 MG/5ML PO SOLN
5.0000 mg | Freq: Once | ORAL | Status: DC | PRN
  Filled 2016-02-11: qty 5

## 2016-02-11 MED ORDER — FENTANYL CITRATE (PF) 100 MCG/2ML IJ SOLN
25.0000 ug | INTRAMUSCULAR | Status: DC | PRN
  Filled 2016-02-11: qty 1

## 2016-02-11 MED ORDER — PROPOFOL 10 MG/ML IV BOLUS
INTRAVENOUS | Status: DC | PRN
  Administered 2016-02-11: 30 mg via INTRAVENOUS

## 2016-02-11 MED ORDER — LACTATED RINGERS IV SOLN
INTRAVENOUS | Status: DC
  Filled 2016-02-11: qty 1000

## 2016-02-11 MED ORDER — MIDAZOLAM HCL 5 MG/5ML IJ SOLN
INTRAMUSCULAR | Status: DC | PRN
  Administered 2016-02-11: 2 mg via INTRAVENOUS

## 2016-02-11 MED ORDER — MIDAZOLAM HCL 2 MG/2ML IJ SOLN
INTRAMUSCULAR | Status: AC
  Filled 2016-02-11: qty 2

## 2016-02-11 MED ORDER — DEXAMETHASONE SODIUM PHOSPHATE 10 MG/ML IJ SOLN
INTRAMUSCULAR | Status: AC
  Filled 2016-02-11: qty 1

## 2016-02-11 MED ORDER — CEFAZOLIN SODIUM-DEXTROSE 2-3 GM-% IV SOLR
INTRAVENOUS | Status: AC
  Filled 2016-02-11: qty 50

## 2016-02-11 MED ORDER — KETOROLAC TROMETHAMINE 30 MG/ML IJ SOLN
INTRAMUSCULAR | Status: AC
  Filled 2016-02-11: qty 1

## 2016-02-11 MED ORDER — PROPOFOL 10 MG/ML IV BOLUS
INTRAVENOUS | Status: AC
Start: 2016-02-11 — End: 2016-02-11
  Filled 2016-02-11: qty 20

## 2016-02-11 MED ORDER — FENTANYL CITRATE (PF) 100 MCG/2ML IJ SOLN
INTRAMUSCULAR | Status: AC
  Filled 2016-02-11: qty 2

## 2016-02-11 MED ORDER — ONDANSETRON HCL 4 MG/2ML IJ SOLN
INTRAMUSCULAR | Status: AC
  Filled 2016-02-11: qty 2

## 2016-02-11 SURGICAL SUPPLY — 16 items
CATH ROBINSON RED A/P 16FR (CATHETERS) ×2 IMPLANT
COVER BACK TABLE 60X90IN (DRAPES) ×2 IMPLANT
DRAPE HYSTEROSCOPY (DRAPE) ×2 IMPLANT
DRAPE LG THREE QUARTER DISP (DRAPES) ×2 IMPLANT
DRSG TELFA 3X8 NADH (GAUZE/BANDAGES/DRESSINGS) ×2 IMPLANT
GLOVE BIO SURGEON STRL SZ 6.5 (GLOVE) ×6 IMPLANT
GOWN STRL REUS W/ TWL LRG LVL3 (GOWN DISPOSABLE) ×2 IMPLANT
GOWN STRL REUS W/TWL LRG LVL3 (GOWN DISPOSABLE) ×2
KIT ROOM TURNOVER WOR (KITS) ×2 IMPLANT
LEGGING LITHOTOMY PAIR STRL (DRAPES) ×2 IMPLANT
NEEDLE SPNL 25GX3.5 QUINCKE BL (NEEDLE) ×2 IMPLANT
PACK BASIN DAY SURGERY FS (CUSTOM PROCEDURE TRAY) ×2 IMPLANT
SET GENESYS HTA PROCERVA (MISCELLANEOUS) ×2 IMPLANT
SYR CONTROL 10ML LL (SYRINGE) ×2 IMPLANT
TOWEL OR 17X24 6PK STRL BLUE (TOWEL DISPOSABLE) ×4 IMPLANT
TRAY DSU PREP LF (CUSTOM PROCEDURE TRAY) ×2 IMPLANT

## 2016-02-11 NOTE — Op Note (Signed)
[name], Margaret Hoffman                 ACCOUNT NO.:  [id number]  MEDICAL RECORD NO.:  [id number]  LOCATION:                                 FACILITY:  PHYSICIAN:  [place] Helane Rima, M.D.    DATE OF BIRTH:  DATE OF PROCEDURE:  02/11/2016 DATE OF DISCHARGE:                              OPERATIVE REPORT   PREOPERATIVE DIAGNOSIS:  Postmenopausal bleeding and thickened endometrium.  POSTOPERATIVE DIAGNOSIS:  Postmenopausal bleeding, thickened endometrium, cervical stenosis, and possible uterine perforation.  SURGEON:  Elmina Hendel L. Helane Rima, M.D.  PROCEDURES:  Cervical dilation, uterine curettage, attempted diagnostic hysteroscopy.  ANESTHESIA:  MAC with paracervical block.  EBL:  Minimal.  COMPLICATIONS:  Possible uterine perforation.  PATHOLOGY:  Uterine curettings.  PROCEDURE:  The patient was taken to the operating room.  After she was consented about the risk of the procedure, she was then prepped and draped in and out catheters used to empty the bladder.  The cervix was grasped with a tenaculum.  We first started with the smallest Pratt dilator and I could see that she had severe stenosis of the internal os, so I used an os binder to dilate the cervical internal os.  It was very difficult to dilate even with the os binder.  It was then able to dilate the cervical internal os slowly, but she had severe cervical stenosis. I then inserted the smallest uterine curette and was able to thoroughly curette the tissue and all tissue appeared to be endometrial and was sent to pathology.  I then removed the curette and then inserted the hysteroscope with the HTA.  I noticed upon entry into the uterine cavity that I could not get it good distention.  We then attempted to do a fluid check and there was a rapid loss of fluid.  I believe that she had a small perforation probably from the cervical dilation, so I did a band in the procedure, and I did not feel comfortable doing the HTA.  I  do feel that I did sample the endometrium well.  The patient will be instructed about the possible uterine perforation.  All tissue was sent to pathology.  She went to recovery room in stable condition.  She will be discharged from recovery room and will be following up with me next week in the office.     Huxton Glaus L. Helane Rima, M.D.     Nevin Bloodgood  D:  02/11/2016  T:  02/11/2016  Job:  [id number]

## 2016-02-11 NOTE — Transfer of Care (Signed)
Immediate Anesthesia Transfer of Care Note  Patient: Margaret Hoffman  Procedure(s) Performed: Procedure(s): DILATATION & CURETTAGE/HYSTEROSCOPY   Patient Location: PACU  Anesthesia Type: MAC  Level of Consciousness: awake, alert , oriented and patient cooperative  Airway & Oxygen Therapy: Patient Spontanous Breathing and Patient connected to face mask oxygen  Post-op Assessment: Report given to PACU RN and Post -op Vital signs reviewed and stable  Post vital signs: Reviewed and stable  Complications: No apparent anesthesia complications

## 2016-02-11 NOTE — Progress Notes (Signed)
H and P on the chart No changes Will proceed with D and C, HYSTEROSCOPY and HTA Consent signed

## 2016-02-11 NOTE — Anesthesia Postprocedure Evaluation (Signed)
Anesthesia Post Note  Patient: Margaret Hoffman  Procedure(s) Performed: Procedure(s): DILATATION & CURETTAGE/HYSTEROSCOPY   Patient location during evaluation: PACU Anesthesia Type: MAC Level of consciousness: awake and alert Pain management: pain level controlled Vital Signs Assessment: post-procedure vital signs reviewed and stable Respiratory status: spontaneous breathing, nonlabored ventilation, respiratory function stable and patient connected to nasal cannula oxygen Cardiovascular status: stable and blood pressure returned to baseline Anesthetic complications: no    Last Vitals:  Filed Vitals:   02/11/16 0815 02/11/16 0830  BP: 94/61 102/72  Pulse: 57 64  Temp:    Resp: 10 12    Last Pain: There were no vitals filed for this visit.               [place]

## 2016-02-11 NOTE — Discharge Instructions (Signed)

## 2016-02-11 NOTE — Anesthesia Preprocedure Evaluation (Addendum)
Anesthesia Evaluation  Patient identified by MRN, date of birth, ID band Patient awake    Reviewed: Allergy & Precautions, H&P , NPO status , Patient's Chart, lab work & pertinent test results  Airway Mallampati: II   Neck ROM: full    Dental   Pulmonary neg pulmonary ROS,    breath sounds clear to auscultation       Cardiovascular negative cardio ROS   Rhythm:regular Rate:Normal     Neuro/Psych    GI/Hepatic   Endo/Other    Renal/GU      Musculoskeletal   Abdominal   Peds  Hematology   Anesthesia Other Findings   Reproductive/Obstetrics                             Anesthesia Physical Anesthesia Plan  ASA: I  Anesthesia Plan: MAC   Post-op Pain Management:    Induction: Intravenous  Airway Management Planned: Simple Face Mask  Additional Equipment:   Intra-op Plan:   Post-operative Plan:   Informed Consent: I have reviewed the patients History and Physical, chart, labs and discussed the procedure including the risks, benefits and alternatives for the proposed anesthesia with the patient or authorized representative who has indicated his/her understanding and acceptance.     Plan Discussed with: CRNA, Anesthesiologist and Surgeon  Anesthesia Plan Comments:         Anesthesia Quick Evaluation  

## 2016-02-11 NOTE — Brief Op Note (Signed)
02/11/2016  7:59 AM  PATIENT:  Margaret Hoffman  55 y.o. female  PRE-OPERATIVE DIAGNOSIS:  Postmenopausal Bleeding Thickened endometrium  POST-OPERATIVE DIAGNOSIS:  Same Cervical Stenosis Possible uterine perforation  PROCEDURE:   Cervical Dilation Uterine Curettage Attempted Hysteroscopy  SURGEON:  Surgeon(s) and Role:    * Dian Queen, MD - Primary  PHYSICIAN ASSISTANT:   ASSISTANTS: none   ANESTHESIA:   paracervical block and MAC  EBL:  Total I/O In: 200 [I.V.:200] Out: -   BLOOD ADMINISTERED:none  DRAINS: none   LOCAL MEDICATIONS USED:  LIDOCAINE   SPECIMEN:  Source of Specimen:  uterine curettings  DISPOSITION OF SPECIMEN:  Source of Specimen:  uterine currettings  COUNTS:  YES  TOURNIQUET:  * No tourniquets in log *  DICTATION: .Other Dictation: Dictation Number [id number]  PLAN OF CARE: Discharge to home after PACU  PATIENT DISPOSITION:  PACU - hemodynamically stable.   Delay start of Pharmacological VTE agent (>24hrs) due to surgical blood loss or risk of bleeding: not applicable

## 2016-02-12 ENCOUNTER — Encounter (HOSPITAL_BASED_OUTPATIENT_CLINIC_OR_DEPARTMENT_OTHER): Payer: Self-pay | Admitting: Obstetrics and Gynecology

## 2016-03-01 DIAGNOSIS — Z6821 Body mass index (BMI) 21.0-21.9, adult: Secondary | ICD-10-CM | POA: Diagnosis not present

## 2016-03-01 DIAGNOSIS — Z01419 Encounter for gynecological examination (general) (routine) without abnormal findings: Secondary | ICD-10-CM | POA: Diagnosis not present

## 2016-03-04 MED FILL — FLUCONAZOLE 150 MG TABLET: 150 | 1 days supply | Qty: 1 | Fill #2

## 2016-03-04 MED FILL — ELESTRIN 0.06% GEL: 0.52 MG/0.8 | 30 days supply | Qty: 52 | Fill #0

## 2016-03-04 MED FILL — SERTRALINE HCL 50 MG TABLET: 50 | 30 days supply | Qty: 30 | Fill #3

## 2016-03-04 MED FILL — PROGESTERONE 200 MG CAPSULE: 200 | 30 days supply | Qty: 30 | Fill #0

## 2016-03-04 MED FILL — NYSTATIN 100,000 UNITS/GM O: 100000 | 30 days supply | Qty: 30 | Fill #0

## 2016-03-04 MED FILL — IBUPROFEN 800 MG TABLET: 800 | 10 days supply | Qty: 30 | Fill #0

## 2016-03-07 MED FILL — ALPRAZolam 0.5 MG TABS: 0.5 | 30 days supply | Qty: 30 | Fill #3

## 2016-03-26 MED FILL — FLUCONAZOLE 150 MG TABLET: 150 | 1 days supply | Qty: 1 | Fill #3

## 2016-03-30 DIAGNOSIS — Z0100 Encounter for examination of eyes and vision without abnormal findings: Secondary | ICD-10-CM | POA: Diagnosis not present

## 2016-03-31 MED FILL — ELESTRIN 0.06% GEL: 0.52 MG/0.8 | 30 days supply | Qty: 52 | Fill #1

## 2016-03-31 MED FILL — SERTRALINE HCL 50 MG TABLET: 50 | 30 days supply | Qty: 30 | Fill #4

## 2016-03-31 MED FILL — PROGESTERONE 200 MG CAPSULE: 200 | 30 days supply | Qty: 30 | Fill #1

## 2016-03-31 MED FILL — NYSTATIN 100,000 UNITS/GM O: 100000 | 30 days supply | Qty: 30 | Fill #1

## 2016-04-04 MED FILL — ALPRAZolam 0.5 MG TABS: 0.5 | 30 days supply | Qty: 30 | Fill #4

## 2016-04-29 MED FILL — PROPRANOLOL 10 MG TABLET: 10 | 90 days supply | Qty: 90 | Fill #1

## 2016-04-29 MED FILL — SERTRALINE HCL 50 MG TABLET: 50 | 30 days supply | Qty: 30 | Fill #5

## 2016-04-29 MED FILL — ELESTRIN 0.06% GEL: 0.52 MG/0.8 | 30 days supply | Qty: 52 | Fill #2

## 2016-04-29 MED FILL — PROGESTERONE 200 MG CAPSULE: 200 | 30 days supply | Qty: 30 | Fill #2

## 2016-04-29 MED FILL — NYSTATIN 100,000 UNITS/GM O: 100000 | 30 days supply | Qty: 30 | Fill #2

## 2016-05-02 MED FILL — ALPRAZolam 0.5 MG TABS: 0.5 | 30 days supply | Qty: 30 | Fill #5

## 2016-05-31 MED FILL — SERTRALINE HCL 50 MG TABLET: 50 | 30 days supply | Qty: 30 | Fill #6

## 2016-05-31 MED FILL — NYSTATIN 100,000 UNITS/GM O: 100000 | 30 days supply | Qty: 30 | Fill #3

## 2016-05-31 MED FILL — ALPRAZolam 0.5 MG TABS: 0.5 | 30 days supply | Qty: 30 | Fill #0

## 2016-05-31 MED FILL — PROGESTERONE 200 MG CAPSULE: 200 | 30 days supply | Qty: 30 | Fill #3

## 2016-06-03 MED FILL — ELESTRIN 0.06% GEL: 0.52 MG/0.8 | 30 days supply | Qty: 52 | Fill #3

## 2016-06-06 MED FILL — FLUCONAZOLE 150 MG TABLET: 150 | 1 days supply | Qty: 1 | Fill #0

## 2016-06-30 MED FILL — ALPRAZolam 0.5 MG TABS: 0.5 | 30 days supply | Qty: 30 | Fill #1

## 2016-06-30 MED FILL — SERTRALINE HCL 50 MG TABLET: 50 | 30 days supply | Qty: 30 | Fill #7

## 2016-08-05 MED FILL — SERTRALINE HCL 50 MG TABLET: 50 | 30 days supply | Qty: 30 | Fill #8

## 2016-08-05 MED FILL — ALPRAZolam 0.5 MG TABS: 0.5 | 30 days supply | Qty: 30 | Fill #2

## 2016-08-08 ENCOUNTER — Other Ambulatory Visit: Payer: Self-pay | Admitting: Internal Medicine

## 2016-08-08 DIAGNOSIS — Z1231 Encounter for screening mammogram for malignant neoplasm of breast: Secondary | ICD-10-CM

## 2016-09-12 MED FILL — ALPRAZolam 0.5 MG TABS: 0.5 | 30 days supply | Qty: 30 | Fill #3

## 2016-09-12 MED FILL — SERTRALINE HCL 50 MG TABLET: 50 | 30 days supply | Qty: 30 | Fill #9

## 2016-09-20 ENCOUNTER — Ambulatory Visit
Admission: RE | Admit: 2016-09-20 | Discharge: 2016-09-20 | Disposition: A | Discharge status: Home / Self Care | Payer: BLUE CROSS/BLUE SHIELD | Source: Ambulatory Visit | Attending: Internal Medicine | Admitting: Internal Medicine

## 2016-09-20 DIAGNOSIS — Z1231 Encounter for screening mammogram for malignant neoplasm of breast: Secondary | ICD-10-CM | POA: Diagnosis not present

## 2016-09-27 DIAGNOSIS — M25562 Pain in left knee: Secondary | ICD-10-CM | POA: Diagnosis not present

## 2016-09-27 DIAGNOSIS — Z6821 Body mass index (BMI) 21.0-21.9, adult: Secondary | ICD-10-CM | POA: Diagnosis not present

## 2016-09-27 DIAGNOSIS — Z1389 Encounter for screening for other disorder: Secondary | ICD-10-CM | POA: Diagnosis not present

## 2016-09-27 DIAGNOSIS — Z0000 Encounter for general adult medical examination without abnormal findings: Secondary | ICD-10-CM | POA: Diagnosis not present

## 2016-09-27 DIAGNOSIS — E784 Other hyperlipidemia: Secondary | ICD-10-CM | POA: Diagnosis not present

## 2016-10-11 MED FILL — ALPRAZolam 0.5 MG TABS: 0.5 | 30 days supply | Qty: 30 | Fill #4

## 2016-10-11 MED FILL — SERTRALINE HCL 50 MG TABLET: 50 | 30 days supply | Qty: 30 | Fill #10

## 2016-11-08 MED FILL — ALPRAZolam 0.5 MG TABS: 0.5 | 30 days supply | Qty: 30 | Fill #5

## 2016-11-08 MED FILL — SERTRALINE HCL 50 MG TABLET: 50 | 30 days supply | Qty: 30 | Fill #11

## 2016-11-15 DIAGNOSIS — H1013 Acute atopic conjunctivitis, bilateral: Secondary | ICD-10-CM | POA: Diagnosis not present

## 2016-11-15 MED FILL — PREDNISOLONE AC 1% EYE DROP: 1 | 12 days supply | Qty: 5 | Fill #0

## 2016-11-22 DIAGNOSIS — H1013 Acute atopic conjunctivitis, bilateral: Secondary | ICD-10-CM | POA: Diagnosis not present

## 2016-11-30 DIAGNOSIS — F410 Panic disorder [episodic paroxysmal anxiety] without agoraphobia: Secondary | ICD-10-CM | POA: Diagnosis not present

## 2016-12-13 MED FILL — ALPRAZolam 0.5 MG TABS: 0.5 | 30 days supply | Qty: 30 | Fill #0

## 2016-12-13 MED FILL — SERTRALINE HCL 50 MG TABLET: 50 | 30 days supply | Qty: 30 | Fill #0

## 2017-01-11 MED FILL — ALPRAZolam 0.5 MG TABS: 0.5 | 30 days supply | Qty: 30 | Fill #1

## 2017-01-11 MED FILL — SERTRALINE HCL 50 MG TABLET: 50 | 30 days supply | Qty: 30 | Fill #1

## 2017-02-08 DIAGNOSIS — N390 Urinary tract infection, site not specified: Secondary | ICD-10-CM | POA: Diagnosis not present

## 2017-02-08 DIAGNOSIS — R102 Pelvic and perineal pain: Secondary | ICD-10-CM | POA: Diagnosis not present

## 2017-02-09 MED FILL — SERTRALINE HCL 50 MG TABLET: 50 | 30 days supply | Qty: 30 | Fill #2

## 2017-02-09 MED FILL — ALPRAZolam 0.5 MG TABS: 0.5 | 30 days supply | Qty: 30 | Fill #2

## 2017-02-21 ENCOUNTER — Other Ambulatory Visit: Payer: Self-pay | Admitting: Obstetrics and Gynecology

## 2017-02-21 DIAGNOSIS — R102 Pelvic and perineal pain: Secondary | ICD-10-CM

## 2017-02-24 ENCOUNTER — Ambulatory Visit
Admission: RE | Admit: 2017-02-24 | Discharge: 2017-02-24 | Disposition: A | Discharge status: Home / Self Care | Payer: BLUE CROSS/BLUE SHIELD | Source: Ambulatory Visit | Attending: Obstetrics and Gynecology | Admitting: Obstetrics and Gynecology

## 2017-02-24 DIAGNOSIS — R102 Pelvic and perineal pain: Secondary | ICD-10-CM

## 2017-02-24 MED ORDER — IOPAMIDOL (ISOVUE-300) INJECTION 61%
100.0000 mL | Freq: Once | INTRAVENOUS | Status: AC | PRN
  Administered 2017-02-24: 100 mL via INTRAVENOUS

## 2017-03-08 MED FILL — SERTRALINE HCL 50 MG TABLET: 50 | 30 days supply | Qty: 30 | Fill #3

## 2017-03-08 MED FILL — ALPRAZolam 0.5 MG TABS: 0.5 | 30 days supply | Qty: 30 | Fill #3

## 2017-03-14 ENCOUNTER — Ambulatory Visit: Payer: BLUE CROSS/BLUE SHIELD | Admitting: Physical Therapy

## 2017-04-10 MED FILL — ALPRAZolam 0.5 MG TABS: 0.5 | 30 days supply | Qty: 30 | Fill #4

## 2017-04-10 MED FILL — SERTRALINE HCL 50 MG TABLET: 50 | 30 days supply | Qty: 30 | Fill #4

## 2017-04-25 DIAGNOSIS — F410 Panic disorder [episodic paroxysmal anxiety] without agoraphobia: Secondary | ICD-10-CM | POA: Diagnosis not present

## 2017-05-09 MED FILL — ALPRAZolam 0.5 MG TABS: 0.5 | 30 days supply | Qty: 30 | Fill #5

## 2017-05-09 MED FILL — SERTRALINE HCL 50 MG TABLET: 50 | 30 days supply | Qty: 30 | Fill #5

## 2017-06-05 MED FILL — SERTRALINE HCL 50 MG TABLET: 50 | 22 days supply | Qty: 90 | Fill #0

## 2017-06-05 MED FILL — ALPRAZolam 0.5 MG TABS: 0.5 | 90 days supply | Qty: 90 | Fill #0

## 2017-06-29 DIAGNOSIS — H16223 Keratoconjunctivitis sicca, not specified as Sjogren's, bilateral: Secondary | ICD-10-CM | POA: Diagnosis not present

## 2017-09-06 MED FILL — ALPRAZolam 0.5 MG TABS: 0.5 | 90 days supply | Qty: 90 | Fill #1

## 2017-09-06 MED FILL — SERTRALINE HCL 50 MG TABLET: 50 | 22 days supply | Qty: 90 | Fill #1

## 2017-09-14 ENCOUNTER — Other Ambulatory Visit: Payer: Self-pay | Admitting: Obstetrics and Gynecology

## 2017-09-14 DIAGNOSIS — Z1231 Encounter for screening mammogram for malignant neoplasm of breast: Secondary | ICD-10-CM

## 2017-09-22 ENCOUNTER — Ambulatory Visit
Admission: RE | Admit: 2017-09-22 | Discharge: 2017-09-22 | Disposition: A | Discharge status: Home / Self Care | Payer: BLUE CROSS/BLUE SHIELD | Source: Ambulatory Visit | Attending: Obstetrics and Gynecology | Admitting: Obstetrics and Gynecology

## 2017-09-22 DIAGNOSIS — Z1231 Encounter for screening mammogram for malignant neoplasm of breast: Secondary | ICD-10-CM

## 2017-10-03 DIAGNOSIS — Z0000 Encounter for general adult medical examination without abnormal findings: Secondary | ICD-10-CM | POA: Diagnosis not present

## 2017-10-05 DIAGNOSIS — Z01419 Encounter for gynecological examination (general) (routine) without abnormal findings: Secondary | ICD-10-CM | POA: Diagnosis not present

## 2017-10-05 DIAGNOSIS — Z6822 Body mass index (BMI) 22.0-22.9, adult: Secondary | ICD-10-CM | POA: Diagnosis not present

## 2017-10-10 DIAGNOSIS — Z1389 Encounter for screening for other disorder: Secondary | ICD-10-CM | POA: Diagnosis not present

## 2017-10-10 DIAGNOSIS — Z6822 Body mass index (BMI) 22.0-22.9, adult: Secondary | ICD-10-CM | POA: Diagnosis not present

## 2017-10-10 DIAGNOSIS — Z0000 Encounter for general adult medical examination without abnormal findings: Secondary | ICD-10-CM | POA: Diagnosis not present

## 2017-10-23 DIAGNOSIS — F410 Panic disorder [episodic paroxysmal anxiety] without agoraphobia: Secondary | ICD-10-CM | POA: Diagnosis not present

## 2018-03-23 DIAGNOSIS — N950 Postmenopausal bleeding: Secondary | ICD-10-CM | POA: Diagnosis not present

## 2018-04-03 DIAGNOSIS — F331 Major depressive disorder, recurrent, moderate: Secondary | ICD-10-CM | POA: Diagnosis not present

## 2018-05-21 NOTE — H&P (Signed)
57 year old G 2 P 2 with postmenopausal bleeding Endometrial stripe 9.7  Mm Unable to do EMB secondary to severe cervical stenosis  Past Medical History:  Diagnosis Date  . Abnormal uterine bleeding (AUB)   . Wears contact lenses    Past Surgical History:  Procedure Laterality Date  . DILITATION & CURRETTAGE/HYSTROSCOPY WITH HYDROTHERMAL ABLATION N/A 02/11/2016   Procedure: DILATATION & CURETTAGE/HYSTEROSCOPY ;  Surgeon: Dian Queen, MD;  Location: [place];  Service: Gynecology;  Laterality: N/A;  . HYSTEROSCOPY W/D&C N/A 09/15/2014   Procedure: DILATATION AND CURETTAGE /HYSTEROSCOPY;  Surgeon: Cyril Mourning, MD;  Location: [facility];  Service: Gynecology;  Laterality: N/A;   endometrial polyp   Prior to Admission medications   Medication Sig Start Date End Date Taking? Authorizing Provider  ALPRAZolam Duanne Moron) 0.5 MG tablet Take 0.25 mg by mouth at bedtime as needed for anxiety.    [provider]  Estradiol (ELESTRIN) 0.52 MG/0.87 GM (0.06%) GEL Apply 2 application topically daily.    [provider]  progesterone (PROMETRIUM) 200 MG capsule Take 200 mg by mouth as directed. Qd  Day 1 to 12 of every 3rd month-  Last dose aug 2015    [provider]  sertraline (ZOLOFT) 50 MG tablet Take 50 mg by mouth at bedtime.    [provider]   Allergies Codeine  Family History  Problem Relation Age of Onset  . Breast cancer Neg Hx    Social History   Socioeconomic History  . Marital status: Married    Spouse name: Not on file  . Number of children: Not on file  . Years of education: Not on file  . Highest education level: Not on file  Occupational History  . Not on file  Social Needs  . Financial resource strain: Not on file  . Food insecurity:    Worry: Not on file    Inability: Not on file  . Transportation needs:    Medical: Not on file    Non-medical: Not on file  Tobacco Use  . Smoking status:  Never Smoker  . Smokeless tobacco: Never Used  Substance and Sexual Activity  . Alcohol use: Yes    Comment: rare  . Drug use: No  . Sexual activity: Not on file  Lifestyle  . Physical activity:    Days per week: Not on file    Minutes per session: Not on file  . Stress: Not on file  Relationships  . Social connections:    Talks on phone: Not on file    Gets together: Not on file    Attends religious service: Not on file    Active member of club or organization: Not on file    Attends meetings of clubs or organizations: Not on file    Relationship status: Not on file  Other Topics Concern  . Not on file  Social History Narrative  . Not on file   General alert and oriented Lung CTAB Car RRR Abdomen is soft and non tender Pelvic above  IMPRESSION: Postmenopausal bleeding Cervical Stenosis  PLAN: D and C with ultrasound guidance

## 2018-06-01 ENCOUNTER — Ambulatory Visit (HOSPITAL_COMMUNITY)
Admission: RE | Admit: 2018-06-01 | Payer: BLUE CROSS/BLUE SHIELD | Source: Ambulatory Visit | Admitting: Obstetrics and Gynecology

## 2018-06-01 SURGERY — DILATION AND CURETTAGE
Anesthesia: Choice

## 2018-07-05 NOTE — H&P (Signed)
57 year old G 2 P 2 with persistent postmenopausal bleeding.  Patient also with cervical stenosis.  Past Medical History:  Diagnosis Date  . Abnormal uterine bleeding (AUB)   . Wears contact lenses    Past Surgical History:  Procedure Laterality Date  . DILITATION & CURRETTAGE/HYSTROSCOPY WITH HYDROTHERMAL ABLATION N/A 02/11/2016   Procedure: DILATATION & CURETTAGE/HYSTEROSCOPY ;  Surgeon: Dian Queen, MD;  Location: [place];  Service: Gynecology;  Laterality: N/A;  . HYSTEROSCOPY W/D&C N/A 09/15/2014   Procedure: DILATATION AND CURETTAGE /HYSTEROSCOPY;  Surgeon: Cyril Mourning, MD;  Location: [facility];  Service: Gynecology;  Laterality: N/A;   endometrial polyp   Prior to Admission medications   Medication Sig Start Date End Date Taking? Authorizing Provider  ALPRAZolam Duanne Moron) 0.5 MG tablet Take 0.25 mg by mouth at bedtime as needed for anxiety.    [provider]  Estradiol (ELESTRIN) 0.52 MG/0.87 GM (0.06%) GEL Apply 2 application topically daily.    [provider]  progesterone (PROMETRIUM) 200 MG capsule Take 200 mg by mouth as directed. Qd  Day 1 to 12 of every 3rd month-  Last dose aug 2015    [provider]  sertraline (ZOLOFT) 50 MG tablet Take 50 mg by mouth at bedtime.    [provider]   Allergic to Codeine Family History  Problem Relation Age of Onset  . Breast cancer Neg Hx    Social History   Socioeconomic History  . Marital status: Married    Spouse name: Not on file  . Number of children: Not on file  . Years of education: Not on file  . Highest education level: Not on file  Occupational History  . Not on file  Social Needs  . Financial resource strain: Not on file  . Food insecurity:    Worry: Not on file    Inability: Not on file  . Transportation needs:    Medical: Not on file    Non-medical: Not on file  Tobacco Use  . Smoking status: Never Smoker  . Smokeless tobacco:  Never Used  Substance and Sexual Activity  . Alcohol use: Yes    Comment: rare  . Drug use: No  . Sexual activity: Not on file  Lifestyle  . Physical activity:    Days per week: Not on file    Minutes per session: Not on file  . Stress: Not on file  Relationships  . Social connections:    Talks on phone: Not on file    Gets together: Not on file    Attends religious service: Not on file    Active member of club or organization: Not on file    Attends meetings of clubs or organizations: Not on file    Relationship status: Not on file  Other Topics Concern  . Not on file  Social History Narrative  . Not on file     General alert and oriented Lung CTAB  Car RRR Abdomen is soft and non tender  Pelvic Ultrasound last stripe 9.7 mm with question of endometrial polyp  IMPRESSION: Persistent PMP Bleeding Possible endometrial Polyp Previous benign pathology  PLAN: LAVH, BS Risks reviewed Consent signed

## 2018-07-11 NOTE — Patient Instructions (Addendum)
Margaret Hoffman  07/11/2018      Your procedure is scheduled on 07-18-18   Report to [place]  at 5:30  A.M.  Call this number if you have problems the morning of surgery:[phone]  OUR ADDRESS IS [address], WE ARE LOCATED IN THE [facility]ICAL PLA[facility] WITH [facility].   Remember:  Do not eat food or drink liquids after midnight. None  Take these medicines the morning of surgery with A SIP OF WATER: None   Do not wear jewelry, make-up or nail polish.  Do not wear lotions, powders, or perfumes, or deoderant.  Do not shave 48 hours prior to surgery. .  Do not bring valuables to the hospital.  [facility] is not responsible for any belongings or valuables.  Contacts, dentures or bridgework may not be worn into surgery.  Leave your suitcase in the car.  After surgery it may be brought to your room.  For patients admitted to the hospital, discharge time will be determined by your treatment team.  Patients discharged the day of surgery will not be allowed to drive home.   Special instructions:  Please bring any medication that you might need in the original pill bottle.  Please read over the following fact sheets that you were given:       [facility] - Preparing for Surgery Before surgery, you can play an important role.  Because skin is not sterile, your skin needs to be as free of germs as possible.  You can reduce the number of germs on your skin by washing with CHG (chlorahexidine gluconate) soap before surgery.  CHG is an antiseptic cleaner which kills germs and bonds with the skin to continue killing germs even after washing. Please DO NOT use if you have an allergy to CHG or antibacterial soaps.  If your skin becomes reddened/irritated stop using the CHG and inform your nurse when you arrive at Short Stay. Do not shave (including legs and underarms) for at least 48 hours prior to the first CHG shower.  You may shave your face/neck. Please follow  these instructions carefully:  1.  Shower with CHG Soap the night before surgery and the  morning of Surgery.  2.  If you choose to wash your hair, wash your hair first as usual with your  normal  shampoo.  3.  After you shampoo, rinse your hair and body thoroughly to remove the  shampoo.                           4.  Use CHG as you would any other liquid soap.  You can apply chg directly  to the skin and wash                       Gently with a scrungie or clean washcloth.  5.  Apply the CHG Soap to your body ONLY FROM THE NECK DOWN.   Do not use on face/ open                           Wound or open sores. Avoid contact with eyes, ears mouth and genitals (private parts).                       Wash face,  Genitals (private parts) with your normal soap.  6.  Wash thoroughly, paying special attention to the area where your surgery  will be performed.  7.  Thoroughly rinse your body with warm water from the neck down.  8.  DO NOT shower/wash with your normal soap after using and rinsing off  the CHG Soap.                9.  Pat yourself dry with a clean towel.            10.  Wear clean pajamas.            11.  Place clean sheets on your bed the night of your first shower and do not  sleep with pets. Day of Surgery : Do not apply any lotions/deodorants the morning of surgery.  Please wear clean clothes to the hospital/surgery center.  FAILURE TO FOLLOW THESE INSTRUCTIONS MAY RESULT IN THE CANCELLATION OF YOUR SURGERY PATIENT SIGNATURE_________________________________  NURSE SIGNATURE__________________________________  ________________________________________________________________________

## 2018-07-12 ENCOUNTER — Encounter (HOSPITAL_COMMUNITY)
Admission: RE | Admit: 2018-07-12 | Discharge: 2018-07-12 | Disposition: A | Discharge status: Home / Self Care | Payer: BLUE CROSS/BLUE SHIELD | Source: Ambulatory Visit | Attending: Obstetrics and Gynecology | Admitting: Obstetrics and Gynecology

## 2018-07-12 ENCOUNTER — Other Ambulatory Visit: Payer: Self-pay

## 2018-07-12 ENCOUNTER — Encounter (HOSPITAL_COMMUNITY): Payer: Self-pay

## 2018-07-12 DIAGNOSIS — N882 Stricture and stenosis of cervix uteri: Secondary | ICD-10-CM

## 2018-07-12 DIAGNOSIS — Z01812 Encounter for preprocedural laboratory examination: Secondary | ICD-10-CM | POA: Insufficient documentation

## 2018-07-12 DIAGNOSIS — N950 Postmenopausal bleeding: Secondary | ICD-10-CM | POA: Diagnosis not present

## 2018-07-12 LAB — CBC
HCT: 38.1 % (ref 36.0–46.0)
Hemoglobin: 12.6 g/dL (ref 12.0–15.0)
MCH: 28.9 pg (ref 26.0–34.0)
MCHC: 33.1 g/dL (ref 30.0–36.0)
MCV: 87.4 fL (ref 78.0–100.0)
PLATELETS: 232 10*3/uL (ref 150–400)
RBC: 4.36 MIL/uL (ref 3.87–5.11)
RDW: 12.5 % (ref 11.5–15.5)
WBC: 5.7 10*3/uL (ref 4.0–10.5)

## 2018-07-12 LAB — ABO/RH: ABO/RH(D): A POS

## 2018-07-18 ENCOUNTER — Ambulatory Visit (HOSPITAL_BASED_OUTPATIENT_CLINIC_OR_DEPARTMENT_OTHER): Payer: BLUE CROSS/BLUE SHIELD | Admitting: Anesthesiology

## 2018-07-18 ENCOUNTER — Ambulatory Visit (HOSPITAL_BASED_OUTPATIENT_CLINIC_OR_DEPARTMENT_OTHER)
Admission: RE | Admit: 2018-07-18 | Discharge: 2018-07-19 | Disposition: A | Discharge status: Home / Self Care | Payer: BLUE CROSS/BLUE SHIELD | Source: Ambulatory Visit | Attending: Obstetrics and Gynecology | Admitting: Obstetrics and Gynecology

## 2018-07-18 ENCOUNTER — Encounter (HOSPITAL_BASED_OUTPATIENT_CLINIC_OR_DEPARTMENT_OTHER)
Admission: RE | Disposition: A | Discharge status: Home / Self Care | Payer: Self-pay | Source: Ambulatory Visit | Attending: Obstetrics and Gynecology

## 2018-07-18 ENCOUNTER — Encounter (HOSPITAL_BASED_OUTPATIENT_CLINIC_OR_DEPARTMENT_OTHER): Payer: Self-pay

## 2018-07-18 DIAGNOSIS — D259 Leiomyoma of uterus, unspecified: Secondary | ICD-10-CM | POA: Diagnosis not present

## 2018-07-18 DIAGNOSIS — R9389 Abnormal findings on diagnostic imaging of other specified body structures: Secondary | ICD-10-CM | POA: Diagnosis not present

## 2018-07-18 DIAGNOSIS — D282 Benign neoplasm of uterine tubes and ligaments: Secondary | ICD-10-CM | POA: Insufficient documentation

## 2018-07-18 DIAGNOSIS — N950 Postmenopausal bleeding: Secondary | ICD-10-CM | POA: Diagnosis not present

## 2018-07-18 DIAGNOSIS — Z79899 Other long term (current) drug therapy: Secondary | ICD-10-CM | POA: Diagnosis not present

## 2018-07-18 DIAGNOSIS — N8500 Endometrial hyperplasia, unspecified: Secondary | ICD-10-CM | POA: Diagnosis not present

## 2018-07-18 HISTORY — PX: LAPAROSCOPIC VAGINAL HYSTERECTOMY WITH SALPINGECTOMY: SHX6680

## 2018-07-18 LAB — TYPE AND SCREEN
ABO/RH(D): A POS
Antibody Screen: NEGATIVE

## 2018-07-18 SURGERY — HYSTERECTOMY, VAGINAL, LAPAROSCOPY-ASSISTED, WITH SALPINGECTOMY
Anesthesia: General | Site: Abdomen | Laterality: Bilateral

## 2018-07-18 MED ORDER — SODIUM CHLORIDE 0.9 % IV SOLN
INTRAVENOUS | Status: AC
  Filled 2018-07-18: qty 2

## 2018-07-18 MED ORDER — SODIUM CHLORIDE 0.9 % IV SOLN
2.0000 g | INTRAVENOUS | Status: AC
  Administered 2018-07-18: 2 g via INTRAVENOUS
  Filled 2018-07-18: qty 2

## 2018-07-18 MED ORDER — LACTATED RINGERS IV SOLN
INTRAVENOUS | Status: DC
  Administered 2018-07-18: 06:00:00 via INTRAVENOUS
  Filled 2018-07-18: qty 1000

## 2018-07-18 MED ORDER — SCOPOLAMINE 1 MG/3DAYS TD PT72
MEDICATED_PATCH | TRANSDERMAL | Status: AC
  Filled 2018-07-18: qty 1

## 2018-07-18 MED ORDER — FENTANYL CITRATE (PF) 250 MCG/5ML IJ SOLN
INTRAMUSCULAR | Status: AC
  Filled 2018-07-18: qty 5

## 2018-07-18 MED ORDER — ALPRAZOLAM 0.25 MG PO TABS
0.2500 mg | ORAL_TABLET | Freq: Every evening | ORAL | Status: DC | PRN
  Administered 2018-07-18: 0.25 mg via ORAL
  Filled 2018-07-18: qty 1

## 2018-07-18 MED ORDER — DEXAMETHASONE SODIUM PHOSPHATE 10 MG/ML IJ SOLN
INTRAMUSCULAR | Status: AC
  Filled 2018-07-18: qty 1

## 2018-07-18 MED ORDER — SERTRALINE HCL 50 MG PO TABS
50.0000 mg | ORAL_TABLET | Freq: Every day | ORAL | Status: DC
  Administered 2018-07-18: 50 mg via ORAL
  Filled 2018-07-18: qty 1

## 2018-07-18 MED ORDER — IBUPROFEN 600 MG PO TABS
600.0000 mg | ORAL_TABLET | Freq: Four times a day (QID) | ORAL | Status: DC | PRN
  Administered 2018-07-18: 600 mg via ORAL
  Filled 2018-07-18: qty 1

## 2018-07-18 MED ORDER — KETOROLAC TROMETHAMINE 30 MG/ML IJ SOLN
INTRAMUSCULAR | Status: DC | PRN
  Administered 2018-07-18: 30 mg via INTRAVENOUS

## 2018-07-18 MED ORDER — ACETAMINOPHEN 10 MG/ML IV SOLN
INTRAVENOUS | Status: AC
  Filled 2018-07-18: qty 100

## 2018-07-18 MED ORDER — MENTHOL 3 MG MT LOZG
1.0000 | LOZENGE | OROMUCOSAL | Status: DC | PRN
  Filled 2018-07-18: qty 9

## 2018-07-18 MED ORDER — DIPHENHYDRAMINE HCL 12.5 MG/5ML PO ELIX
12.5000 mg | ORAL_SOLUTION | Freq: Four times a day (QID) | ORAL | Status: DC | PRN
  Filled 2018-07-18: qty 5

## 2018-07-18 MED ORDER — ESTRADIOL 0.0375 MG/24HR TD PTWK
0.0375 mg | MEDICATED_PATCH | TRANSDERMAL | Status: DC
  Filled 2018-07-18: qty 1

## 2018-07-18 MED ORDER — BUPIVACAINE HCL (PF) 0.25 % IJ SOLN
INTRAMUSCULAR | Status: DC | PRN
  Administered 2018-07-18: 3 mL

## 2018-07-18 MED ORDER — PROMETHAZINE HCL 25 MG/ML IJ SOLN
6.2500 mg | INTRAMUSCULAR | Status: DC | PRN
  Filled 2018-07-18: qty 1

## 2018-07-18 MED ORDER — LIDOCAINE 2% (20 MG/ML) 5 ML SYRINGE
INTRAMUSCULAR | Status: DC | PRN
  Administered 2018-07-18: 60 mg via INTRAVENOUS

## 2018-07-18 MED ORDER — KETOROLAC TROMETHAMINE 30 MG/ML IJ SOLN
30.0000 mg | Freq: Once | INTRAMUSCULAR | Status: AC | PRN
  Filled 2018-07-18: qty 1

## 2018-07-18 MED ORDER — ONDANSETRON HCL 4 MG/2ML IJ SOLN
INTRAMUSCULAR | Status: AC
  Filled 2018-07-18: qty 2

## 2018-07-18 MED ORDER — LACTATED RINGERS IV SOLN
INTRAVENOUS | Status: DC
  Filled 2018-07-18: qty 1000

## 2018-07-18 MED ORDER — PROPOFOL 10 MG/ML IV BOLUS
INTRAVENOUS | Status: DC | PRN
  Administered 2018-07-18: 160 mg via INTRAVENOUS

## 2018-07-18 MED ORDER — SUGAMMADEX SODIUM 200 MG/2ML IV SOLN
INTRAVENOUS | Status: AC
  Filled 2018-07-18: qty 2

## 2018-07-18 MED ORDER — HYDROMORPHONE HCL 1 MG/ML IJ SOLN
INTRAMUSCULAR | Status: AC
  Filled 2018-07-18: qty 1

## 2018-07-18 MED ORDER — FENTANYL CITRATE (PF) 100 MCG/2ML IJ SOLN
INTRAMUSCULAR | Status: DC | PRN
  Administered 2018-07-18 (×2): 50 ug via INTRAVENOUS
  Administered 2018-07-18: 100 ug via INTRAVENOUS
  Administered 2018-07-18: 50 ug via INTRAVENOUS

## 2018-07-18 MED ORDER — DIPHENHYDRAMINE HCL 50 MG/ML IJ SOLN
12.5000 mg | Freq: Four times a day (QID) | INTRAMUSCULAR | Status: DC | PRN
  Filled 2018-07-18: qty 0.25

## 2018-07-18 MED ORDER — PROPOFOL 10 MG/ML IV BOLUS
INTRAVENOUS | Status: AC
  Filled 2018-07-18: qty 40

## 2018-07-18 MED ORDER — PHENAZOPYRIDINE HCL 200 MG PO TABS
200.0000 mg | ORAL_TABLET | Freq: Three times a day (TID) | ORAL | Status: DC
  Administered 2018-07-18: 200 mg via ORAL
  Filled 2018-07-18: qty 1

## 2018-07-18 MED ORDER — NALOXONE HCL 0.4 MG/ML IJ SOLN
0.4000 mg | INTRAMUSCULAR | Status: DC | PRN
  Filled 2018-07-18: qty 1

## 2018-07-18 MED ORDER — TRAMADOL HCL 50 MG PO TABS
50.0000 mg | ORAL_TABLET | Freq: Four times a day (QID) | ORAL | Status: DC | PRN
  Filled 2018-07-18: qty 1

## 2018-07-18 MED ORDER — MIDAZOLAM HCL 5 MG/5ML IJ SOLN
INTRAMUSCULAR | Status: DC | PRN
  Administered 2018-07-18: 2 mg via INTRAVENOUS

## 2018-07-18 MED ORDER — KETOROLAC TROMETHAMINE 30 MG/ML IJ SOLN
INTRAMUSCULAR | Status: AC
  Filled 2018-07-18: qty 1

## 2018-07-18 MED ORDER — HYDROMORPHONE HCL 1 MG/ML IJ SOLN
0.2500 mg | INTRAMUSCULAR | Status: DC | PRN
  Administered 2018-07-18 (×2): 0.25 mg via INTRAVENOUS
  Administered 2018-07-18: 0.5 mg via INTRAVENOUS
  Filled 2018-07-18: qty 0.5

## 2018-07-18 MED ORDER — ONDANSETRON HCL 4 MG/2ML IJ SOLN
4.0000 mg | Freq: Four times a day (QID) | INTRAMUSCULAR | Status: DC | PRN
  Filled 2018-07-18: qty 2

## 2018-07-18 MED ORDER — LIDOCAINE 2% (20 MG/ML) 5 ML SYRINGE
INTRAMUSCULAR | Status: AC
  Filled 2018-07-18: qty 5

## 2018-07-18 MED ORDER — SODIUM CHLORIDE 0.9% FLUSH
9.0000 mL | INTRAVENOUS | Status: DC | PRN
  Filled 2018-07-18: qty 10

## 2018-07-18 MED ORDER — SODIUM CHLORIDE 0.9 % IR SOLN
Status: DC | PRN
  Administered 2018-07-18: 3000 mL

## 2018-07-18 MED ORDER — HYDROMORPHONE 1 MG/ML IV SOLN
INTRAVENOUS | Status: DC
  Administered 2018-07-18: 0.4 mg via INTRAVENOUS
  Administered 2018-07-18: 10:00:00 via INTRAVENOUS
  Filled 2018-07-18 (×2): qty 25

## 2018-07-18 MED ORDER — IBUPROFEN 200 MG PO TABS
ORAL_TABLET | ORAL | Status: AC
Start: 2018-07-18 — End: ?
  Filled 2018-07-18: qty 3

## 2018-07-18 MED ORDER — ONDANSETRON HCL 4 MG/2ML IJ SOLN
INTRAMUSCULAR | Status: DC | PRN
  Administered 2018-07-18: 4 mg via INTRAVENOUS

## 2018-07-18 MED ORDER — ROCURONIUM BROMIDE 50 MG/5ML IV SOSY
PREFILLED_SYRINGE | INTRAVENOUS | Status: DC | PRN
  Administered 2018-07-18: 10 mg via INTRAVENOUS
  Administered 2018-07-18: 50 mg via INTRAVENOUS

## 2018-07-18 MED ORDER — DEXAMETHASONE SODIUM PHOSPHATE 10 MG/ML IJ SOLN
INTRAMUSCULAR | Status: DC | PRN
  Administered 2018-07-18: 10 mg via INTRAVENOUS

## 2018-07-18 MED ORDER — SUGAMMADEX SODIUM 200 MG/2ML IV SOLN
INTRAVENOUS | Status: DC | PRN
  Administered 2018-07-18: 150 mg via INTRAVENOUS

## 2018-07-18 MED ORDER — KETOROLAC TROMETHAMINE 30 MG/ML IJ SOLN
30.0000 mg | Freq: Once | INTRAMUSCULAR | Status: AC
  Administered 2018-07-18: 30 mg via INTRAVENOUS
  Filled 2018-07-18: qty 1

## 2018-07-18 MED ORDER — PHENAZOPYRIDINE HCL 100 MG PO TABS
ORAL_TABLET | ORAL | Status: AC
  Filled 2018-07-18: qty 2

## 2018-07-18 MED ORDER — MIDAZOLAM HCL 2 MG/2ML IJ SOLN
INTRAMUSCULAR | Status: AC
  Filled 2018-07-18: qty 2

## 2018-07-18 MED ORDER — ACETAMINOPHEN 10 MG/ML IV SOLN
INTRAVENOUS | Status: DC | PRN
  Administered 2018-07-18: 1000 mg via INTRAVENOUS

## 2018-07-18 SURGICAL SUPPLY — 51 items
APPLICATOR ARISTA FLEXITIP XL (MISCELLANEOUS) IMPLANT
BARRIER ADHS 3X4 INTERCEED (GAUZE/BANDAGES/DRESSINGS) IMPLANT
BLADE CLIPPER SURG (BLADE) IMPLANT
CANISTER SUCT 3000ML PPV (MISCELLANEOUS) IMPLANT
COVER BACK TABLE 80X110 HD (DRAPES) ×2 IMPLANT
COVER MAYO STAND STRL (DRAPES) ×4 IMPLANT
DERMABOND ADVANCED (GAUZE/BANDAGES/DRESSINGS) ×1
DERMABOND ADVANCED .7 DNX12 (GAUZE/BANDAGES/DRESSINGS) ×1 IMPLANT
DRSG COVADERM PLUS 2X2 (GAUZE/BANDAGES/DRESSINGS) IMPLANT
DRSG OPSITE POSTOP 3X4 (GAUZE/BANDAGES/DRESSINGS) ×2 IMPLANT
DURAPREP 26ML APPLICATOR (WOUND CARE) ×2 IMPLANT
ELECT REM PT RETURN 9FT ADLT (ELECTROSURGICAL) ×2
ELECTRODE REM PT RTRN 9FT ADLT (ELECTROSURGICAL) ×1 IMPLANT
GLOVE BIO SURGEON STRL SZ 6.5 (GLOVE) ×8 IMPLANT
GLOVE BIOGEL PI IND STRL 6.5 (GLOVE) ×2 IMPLANT
GLOVE BIOGEL PI IND STRL 7.5 (GLOVE) ×1 IMPLANT
GLOVE BIOGEL PI INDICATOR 6.5 (GLOVE) ×2
GLOVE BIOGEL PI INDICATOR 7.5 (GLOVE) ×1
GLOVE ECLIPSE 6.5 STRL STRAW (GLOVE) ×4 IMPLANT
GLOVE INDICATOR 7.0 STRL GRN (GLOVE) ×4 IMPLANT
HEMOSTAT ARISTA ABSORB 3G PWDR (MISCELLANEOUS) IMPLANT
HOLDER FOLEY CATH W/STRAP (MISCELLANEOUS) ×2 IMPLANT
KIT TURNOVER CYSTO (KITS) ×2 IMPLANT
NEEDLE INSUFFLATION 120MM (ENDOMECHANICALS) ×2 IMPLANT
NS IRRIG 500ML POUR BTL (IV SOLUTION) ×2 IMPLANT
PACK LAVH (CUSTOM PROCEDURE TRAY) ×2 IMPLANT
PACK ROBOTIC GOWN (GOWN DISPOSABLE) ×2 IMPLANT
PACK TRENDGUARD 450 HYBRID PRO (MISCELLANEOUS) IMPLANT
PAD OB MATERNITY 4.3X12.25 (PERSONAL CARE ITEMS) ×2 IMPLANT
PAD PREP 24X48 CUFFED NSTRL (MISCELLANEOUS) ×2 IMPLANT
SEALER TISSUE G2 CVD JAW 45CM (ENDOMECHANICALS) ×2 IMPLANT
SET IRRIG TUBING LAPAROSCOPIC (IRRIGATION / IRRIGATOR) ×2 IMPLANT
SUT VIC AB 0 CT1 18XCR BRD8 (SUTURE) ×2 IMPLANT
SUT VIC AB 0 CT1 36 (SUTURE) ×4 IMPLANT
SUT VIC AB 0 CT1 8-18 (SUTURE) ×2
SUT VIC AB 3-0 PS2 18 (SUTURE) ×1
SUT VIC AB 3-0 PS2 18XBRD (SUTURE) ×1 IMPLANT
SUT VIC AB 3-0 SH 27 (SUTURE)
SUT VIC AB 3-0 SH 27X BRD (SUTURE) IMPLANT
SUT VICRYL 0 TIES 12 18 (SUTURE) ×2 IMPLANT
SUT VICRYL 0 UR6 27IN ABS (SUTURE) ×2 IMPLANT
SYR BULB IRRIGATION 50ML (SYRINGE) IMPLANT
TOWEL OR 17X24 6PK STRL BLUE (TOWEL DISPOSABLE) ×4 IMPLANT
TRAY FOLEY W/BAG SLVR 14FR (SET/KITS/TRAYS/PACK) ×2 IMPLANT
TRENDGUARD 450 HYBRID PRO PACK (MISCELLANEOUS)
TROCAR OPTI TIP 5M 100M (ENDOMECHANICALS) ×2 IMPLANT
TROCAR XCEL BLUNT TIP 100MML (ENDOMECHANICALS) IMPLANT
TROCAR XCEL DIL TIP R 11M (ENDOMECHANICALS) ×2 IMPLANT
TUBING INSUF HEATED (TUBING) ×2 IMPLANT
WARMER LAPAROSCOPE (MISCELLANEOUS) ×2 IMPLANT
WATER STERILE IRR 500ML POUR (IV SOLUTION) ×2 IMPLANT

## 2018-07-18 NOTE — Anesthesia Postprocedure Evaluation (Signed)
Anesthesia Post Note  Patient: Margaret Hoffman  Procedure(s) Performed: LAPAROSCOPIC ASSISTED VAGINAL HYSTERECTOMY WITH SALPINGECTOMY (Bilateral Abdomen)     Patient location during evaluation: PACU Anesthesia Type: General Level of consciousness: awake and alert Pain management: pain level controlled Vital Signs Assessment: post-procedure vital signs reviewed and stable Respiratory status: spontaneous breathing, nonlabored ventilation, respiratory function stable and patient connected to nasal cannula oxygen Cardiovascular status: blood pressure returned to baseline and stable Postop Assessment: no apparent nausea or vomiting Anesthetic complications: no    Last Vitals:  Vitals:   07/18/18 1000 07/18/18 1025  BP: (!) 101/54   Pulse: (!) 56   Resp: 12 13  Temp:    SpO2: 96% 97%    Last Pain:  Vitals:   07/18/18 1025  TempSrc:   PainSc: 8                  Zadin Lange S

## 2018-07-18 NOTE — Progress Notes (Signed)
H and P on the chart No changes Will proceed with LAVH, BS Consent on chart

## 2018-07-18 NOTE — Anesthesia Procedure Notes (Signed)
Procedure Name: Intubation Date/Time: 07/18/2018 7:33 AM Performed by: Bonney Aid, CRNA Pre-anesthesia Checklist: Patient identified, Emergency Drugs available, Suction available and Patient being monitored Patient Re-evaluated:Patient Re-evaluated prior to induction Oxygen Delivery Method: Circle system utilized Preoxygenation: Pre-oxygenation with 100% oxygen Induction Type: IV induction Ventilation: Mask ventilation without difficulty Laryngoscope Size: Mac and 3 Grade View: Grade I Tube type: Oral Number of attempts: 1 Airway Equipment and Method: Stylet Placement Confirmation: ETT inserted through vocal cords under direct vision,  positive ETCO2 and breath sounds checked- equal and bilateral Secured at: 21 cm Tube secured with: Tape Dental Injury: Teeth and Oropharynx as per pre-operative assessment

## 2018-07-18 NOTE — Brief Op Note (Signed)
07/18/2018  9:04 AM  PATIENT:  Margaret Hoffman  57 y.o. female  PRE-OPERATIVE DIAGNOSIS:   Post menopausal bleeding  POST-OPERATIVE DIAGNOSIS:   Postmenopausal bleeding  PROCEDURE:  Procedure(s) with comments: LAPAROSCOPIC ASSISTED VAGINAL HYSTERECTOMY WITH SALPINGECTOMY (Bilateral) - vagina  SURGEON:  Surgeon(s) and Role:    * Dian Queen, MD - Primary    * Maisie Fus, MD - Assisting  PHYSICIAN ASSISTANT:   ASSISTANTS: none   ANESTHESIA:   general  EBL:  100 mL   BLOOD ADMINISTERED:none  DRAINS: Urinary Catheter (Foley)   LOCAL MEDICATIONS USED:  LIDOCAINE   SPECIMEN:  Source of Specimen:  uterus, cervix, and tubes  DISPOSITION OF SPECIMEN:  PATHOLOGY  COUNTS:  YES  TOURNIQUET:  * No tourniquets in log *  DICTATION: .Dragon Dictation and Other Dictation: Dictation Number dictated  PLAN OF CARE: Admit for overnight observation  PATIENT DISPOSITION:  PACU - hemodynamically stable.   Delay start of Pharmacological VTE agent (>24hrs) due to surgical blood loss or risk of bleeding: not applicable

## 2018-07-18 NOTE — Anesthesia Preprocedure Evaluation (Signed)
Anesthesia Evaluation  Patient identified by MRN, date of birth, ID band Patient awake    Reviewed: Allergy & Precautions, NPO status , Patient's Chart, lab work & pertinent test results  Airway Mallampati: II  TM Distance: >3 FB Neck ROM: Full    Dental no notable dental hx.    Pulmonary neg pulmonary ROS,    Pulmonary exam normal breath sounds clear to auscultation       Cardiovascular negative cardio ROS Normal cardiovascular exam Rhythm:Regular Rate:Normal     Neuro/Psych negative neurological ROS  negative psych ROS   GI/Hepatic negative GI ROS, Neg liver ROS,   Endo/Other  negative endocrine ROS  Renal/GU negative Renal ROS  negative genitourinary   Musculoskeletal negative musculoskeletal ROS (+)   Abdominal   Peds negative pediatric ROS (+)  Hematology negative hematology ROS (+)   Anesthesia Other Findings   Reproductive/Obstetrics negative OB ROS                             Anesthesia Physical Anesthesia Plan  ASA: I  Anesthesia Plan: General   Post-op Pain Management:    Induction: Intravenous  PONV Risk Score and Plan: 3 and Ondansetron, Dexamethasone, Midazolam, Treatment may vary due to age or medical condition and Scopolamine patch - Pre-op  Airway Management Planned: Oral ETT  Additional Equipment:   Intra-op Plan:   Post-operative Plan: Extubation in OR  Informed Consent: I have reviewed the patients History and Physical, chart, labs and discussed the procedure including the risks, benefits and alternatives for the proposed anesthesia with the patient or authorized representative who has indicated his/her understanding and acceptance.   Dental advisory given  Plan Discussed with: CRNA and Surgeon  Anesthesia Plan Comments:         Anesthesia Quick Evaluation

## 2018-07-18 NOTE — Transfer of Care (Signed)
Immediate Anesthesia Transfer of Care Note  Patient: Margaret Hoffman  Procedure(s) Performed: LAPAROSCOPIC ASSISTED VAGINAL HYSTERECTOMY WITH SALPINGECTOMY (Bilateral Abdomen)  Patient Location: PACU  Anesthesia Type:General  Level of Consciousness: drowsy and patient cooperative  Airway & Oxygen Therapy: Patient Spontanous Breathing and Patient connected to nasal cannula oxygen  Post-op Assessment: Report given to RN  Post vital signs: Reviewed and stable  Last Vitals:  Vitals Value Taken Time  BP 106/36 07/18/2018  9:10 AM  Temp 36.8 C 07/18/2018  9:10 AM  Pulse 62 07/18/2018  9:13 AM  Resp 9 07/18/2018  9:13 AM  SpO2 100 % 07/18/2018  9:13 AM  Vitals shown include unvalidated device data.  Last Pain:  Vitals:   07/18/18 0604  TempSrc:   PainSc: 0-No pain      Patients Stated Pain Goal: 5 (60/02/98 4730)  Complications: No apparent anesthesia complications

## 2018-07-18 NOTE — Progress Notes (Signed)
07/18/2018 1850 Pt. C/o severe burning sensation during urination. Dr. Helane Rima paged and made aware. Verbal order received for Pyridium 200 mg PO TID. Orders placed. Will continue to closely monitor patient.  Etherine Mackowiak, Arville Lime

## 2018-07-19 DIAGNOSIS — Z79899 Other long term (current) drug therapy: Secondary | ICD-10-CM | POA: Diagnosis not present

## 2018-07-19 DIAGNOSIS — D282 Benign neoplasm of uterine tubes and ligaments: Secondary | ICD-10-CM | POA: Diagnosis not present

## 2018-07-19 DIAGNOSIS — D259 Leiomyoma of uterus, unspecified: Secondary | ICD-10-CM | POA: Diagnosis not present

## 2018-07-19 DIAGNOSIS — N950 Postmenopausal bleeding: Secondary | ICD-10-CM | POA: Diagnosis not present

## 2018-07-19 LAB — CBC
HCT: 32.6 % — ABNORMAL LOW (ref 36.0–46.0)
HEMOGLOBIN: 10.9 g/dL — AB (ref 12.0–15.0)
MCH: 29.5 pg (ref 26.0–34.0)
MCHC: 33.4 g/dL (ref 30.0–36.0)
MCV: 88.1 fL (ref 78.0–100.0)
PLATELETS: 183 10*3/uL (ref 150–400)
RBC: 3.7 MIL/uL — AB (ref 3.87–5.11)
RDW: 12.6 % (ref 11.5–15.5)
WBC: 9.4 10*3/uL (ref 4.0–10.5)

## 2018-07-19 MED ORDER — PHENAZOPYRIDINE HCL 100 MG PO TABS
ORAL_TABLET | ORAL | Status: AC
  Filled 2018-07-19: qty 2

## 2018-07-19 MED ORDER — PHENAZOPYRIDINE HCL 200 MG PO TABS: 200.0000 mg | ORAL_TABLET | Freq: Three times a day (TID) | ORAL | 0 refills | Status: DC

## 2018-07-19 MED ORDER — IBUPROFEN 600 MG PO TABS: 600.0000 mg | ORAL_TABLET | Freq: Four times a day (QID) | ORAL | 0 refills | Status: DC | PRN

## 2018-07-19 NOTE — Discharge Summary (Signed)
Admission Diagnosis: Postmenopausal bleeding  Discharge Diagnosis: Same  Hospital Course: 57 year old female admitted for LAVH and BS. She did very well post op .  By POD # 1 she was ambulating, voiding, and tolerating regular diet. She was only taking Ibuprofen.  BP (!) 98/51 (BP Location: Right Arm)   Pulse 66   Temp 98.6 F (37 C)   Resp 16   Ht 5\' 7"  (1.702 m)   Wt 63 kg (138 lb 14.4 oz)   LMP 12/25/2012   SpO2 96%   BMI 21.75 kg/m  Abdomen is soft and non tender  No vaginal bleeding  Results for orders placed or performed during the hospital encounter of 07/18/18 (from the past 24 hour(s))  CBC     Status: Abnormal   Collection Time: 07/19/18  5:17 AM  Result Value Ref Range   WBC 9.4 4.0 - 10.5 K/uL   RBC 3.70 (L) 3.87 - 5.11 MIL/uL   Hemoglobin 10.9 (L) 12.0 - 15.0 g/dL   HCT 32.6 (L) 36.0 - 46.0 %   MCV 88.1 78.0 - 100.0 fL   MCH 29.5 26.0 - 34.0 pg   MCHC 33.4 30.0 - 36.0 g/dL   RDW 12.6 11.5 - 15.5 %   Platelets 183 150 - 400 K/uL   Patient discharged home with pyridium and ibuprofen Follow up in 1 week

## 2018-07-19 NOTE — Progress Notes (Signed)
Wasted 22mg  IV Dilaudid in sink from unused PCA. Witnessed by K.Hill Therapist, sports.  Blair Hailey, RN

## 2018-07-19 NOTE — Op Note (Signed)
NAMEKALAYA, INFANTINO MEDICAL RECORD [id number] ACCOUNT [id number] DATE OF BIRTH:Apr 07, 1961 FACILITY: [facility] LOCATION: [facility] PHYSICIAN:Jadah Bobak Lynett Fish, MD  OPERATIVE REPORT  DATE OF PROCEDURE:  07/18/2018  PREOPERATIVE DIAGNOSES:  Postmenopausal bleeding, thickened endometrium, history of endometrial polyp and cervical stenosis.  POSTOPERATIVE DIAGNOSES:  Postmenopausal bleeding, thickened endometrium, history of endometrial polyp and cervical stenosis.  PROCEDURE:  Laparoscopic assisted vaginal hysterectomy with bilateral salpingectomy.  SURGEON:  Dian Queen, MD  ASSISTANT:  Dr. Milta Deiters   ANESTHESIA:  General.  ESTIMATED BLOOD LOSS:  100 mL.  CATHETERS:  Foley.  PATHOLOGY:  Uterus, cervix, fallopian tubes sent to Pathology.  COMPLICATIONS:  None.  DESCRIPTION OF PROCEDURE:  The patient was taken to the operating room.  She was intubated.  She was prepped and draped in sterile fashion.  Uterine manipulator was inserted and a Foley catheter was inserted and draining clear urine.  Attention was  turned to the abdomen where a small infraumbilical incision was made.  The Veress needle was inserted.  Pneumoperitoneum was performed.  The Veress needle was removed and an 11 mm trocar was inserted.  The patient was gently placed in Trendelenburg  position and a 5 mm trocar was inserted under direct visualization.  Exam of the abdomen and pelvis revealed that the ovaries appeared unremarkable.  I used a grasper and elevated the right fallopian tube placed the EnSeal across the mesosalpinx and then  cauterized and cut and carried down to the round ligament.  We then cauterized and cut the triple pedicle on the right side.  This was done with excellent hemostasis.  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  On the left side of the pelvis, the left fallopian tube was elevated and EnSeal was placed across the  mesosalpinx and carried down to the round ligament and this was done on the right side as well.  We then placed a weighted  speculum in the vagina after pneumoperitoneum was released.  Cervix was grasped with a tenaculum.  A circumferential incision was made around the cervix.  The posterior cul-de-sac was entered using curved Mayo scissors and the anterior cul-de-sac was  entered using Mayo scissors.  The uterosacral cardinal ligaments were then clamped on either side using curved Heaney clamps.  Each pedicle was clamped and suture ligated using 0 Vicryl suture.  We carefully walked the way up the broad ligament were  careful attention to avoid the surrounding tissue and staying snug beside the cervix and uterus.  Each pedicle was clamped, cut and suture ligated using 0 Vicryl suture.  The uterus was retroflexed and the remainder of the broad ligament was clamped on  either side and the specimen was removed.  The pedicles were secured using 0 Vicryl suture.  The posterior cuff was closed using 0 Vicryl suture in a running locked stitch.  The remainder of the cuff was then closed anterior to posterior in a running  locked stitch using 0 Vicryl suture.  We then replaced the pneumoperitoneum, irrigated the pelvis.  Hemostasis was very good.  The pneumoperitoneum was released.  The trocars were removed.  Incisions were closed using Dermabond.  All sponge, lap and  instrument counts were correct x2.  Urine output was clear.    She was extubated and went to recovery room in stable condition.  AN/NUANCE  D:07/19/2018 T:07/19/2018 JOB:[id number]

## 2018-07-20 ENCOUNTER — Encounter (HOSPITAL_BASED_OUTPATIENT_CLINIC_OR_DEPARTMENT_OTHER): Payer: Self-pay | Admitting: Obstetrics and Gynecology

## 2018-08-08 DIAGNOSIS — H10413 Chronic giant papillary conjunctivitis, bilateral: Secondary | ICD-10-CM | POA: Diagnosis not present

## 2018-08-21 DIAGNOSIS — Z1329 Encounter for screening for other suspected endocrine disorder: Secondary | ICD-10-CM | POA: Diagnosis not present

## 2018-08-21 DIAGNOSIS — Z4889 Encounter for other specified surgical aftercare: Secondary | ICD-10-CM | POA: Diagnosis not present

## 2018-08-21 DIAGNOSIS — R5383 Other fatigue: Secondary | ICD-10-CM | POA: Diagnosis not present

## 2018-08-21 DIAGNOSIS — Z13220 Encounter for screening for lipoid disorders: Secondary | ICD-10-CM | POA: Diagnosis not present

## 2018-08-21 DIAGNOSIS — Z131 Encounter for screening for diabetes mellitus: Secondary | ICD-10-CM | POA: Diagnosis not present

## 2018-08-21 DIAGNOSIS — E785 Hyperlipidemia, unspecified: Secondary | ICD-10-CM | POA: Diagnosis not present

## 2018-08-21 DIAGNOSIS — N950 Postmenopausal bleeding: Secondary | ICD-10-CM | POA: Diagnosis not present

## 2018-08-28 ENCOUNTER — Other Ambulatory Visit: Payer: Self-pay | Admitting: Obstetrics and Gynecology

## 2018-08-28 DIAGNOSIS — Z1231 Encounter for screening mammogram for malignant neoplasm of breast: Secondary | ICD-10-CM

## 2018-09-04 ENCOUNTER — Telehealth: Payer: Self-pay | Admitting: Internal Medicine

## 2018-09-04 NOTE — Telephone Encounter (Signed)
Hi Dr. [name], we have received a referral from pt's PCP for a repeat colonoscopy. Pt had colon in 2013 with Dr. Earlean Shawl and is requesting to see you for her procedure.. Colon report and path will be placed on your desk for review. Pt wants to know if she could wait longer for procedure or if she could have a less invasive exam like cologuard. Please advise. Thank you.

## 2018-10-01 NOTE — Telephone Encounter (Signed)
Dr. [name] reviewed patient's records and okay to schedule direct colon.  Called and spoke w/patient and she said she will call back to schedule.  Records placed in the "records reviewed" folder

## 2018-10-02 ENCOUNTER — Ambulatory Visit
Admission: RE | Admit: 2018-10-02 | Discharge: 2018-10-02 | Disposition: A | Discharge status: Home / Self Care | Payer: BLUE CROSS/BLUE SHIELD | Source: Ambulatory Visit | Attending: Obstetrics and Gynecology | Admitting: Obstetrics and Gynecology

## 2018-10-02 DIAGNOSIS — Z1231 Encounter for screening mammogram for malignant neoplasm of breast: Secondary | ICD-10-CM

## 2018-10-02 DIAGNOSIS — F331 Major depressive disorder, recurrent, moderate: Secondary | ICD-10-CM | POA: Diagnosis not present

## 2018-10-02 DIAGNOSIS — F411 Generalized anxiety disorder: Secondary | ICD-10-CM | POA: Diagnosis not present

## 2018-10-08 DIAGNOSIS — S0501XA Injury of conjunctiva and corneal abrasion without foreign body, right eye, initial encounter: Secondary | ICD-10-CM | POA: Diagnosis not present

## 2018-10-15 ENCOUNTER — Encounter: Payer: Self-pay | Admitting: Internal Medicine

## 2018-10-26 DIAGNOSIS — Z0000 Encounter for general adult medical examination without abnormal findings: Secondary | ICD-10-CM | POA: Diagnosis not present

## 2018-10-26 DIAGNOSIS — Z6821 Body mass index (BMI) 21.0-21.9, adult: Secondary | ICD-10-CM | POA: Diagnosis not present

## 2018-10-26 DIAGNOSIS — Z23 Encounter for immunization: Secondary | ICD-10-CM | POA: Diagnosis not present

## 2018-11-09 ENCOUNTER — Encounter: Payer: Self-pay | Admitting: Internal Medicine

## 2018-11-09 ENCOUNTER — Ambulatory Visit (AMBULATORY_SURGERY_CENTER): Payer: Self-pay

## 2018-11-09 VITALS — Ht 67.0 in | Wt 137.4 lb

## 2018-11-09 DIAGNOSIS — Z86010 Personal history of colonic polyps: Secondary | ICD-10-CM

## 2018-11-09 MED ORDER — NA SULFATE-K SULFATE-MG SULF 17.5-3.13-1.6 GM/177ML PO SOLN: 1.0000 | Freq: Once | ORAL | 0 refills | Status: AC

## 2018-11-09 NOTE — Progress Notes (Signed)
Per pt, no allergies to soy or egg products.Pt not taking any weight loss meds or using  O2 at home.  Pt refused emmi video. 

## 2018-11-20 ENCOUNTER — Encounter: Payer: Self-pay | Admitting: Internal Medicine

## 2018-11-20 ENCOUNTER — Ambulatory Visit (AMBULATORY_SURGERY_CENTER): Payer: BLUE CROSS/BLUE SHIELD | Admitting: Internal Medicine

## 2018-11-20 VITALS — BP 111/60 | HR 58 | Temp 97.8°F | Resp 12 | Ht 67.0 in | Wt 137.0 lb

## 2018-11-20 DIAGNOSIS — Z86010 Personal history of colonic polyps: Secondary | ICD-10-CM

## 2018-11-20 DIAGNOSIS — D123 Benign neoplasm of transverse colon: Secondary | ICD-10-CM

## 2018-11-20 DIAGNOSIS — D122 Benign neoplasm of ascending colon: Secondary | ICD-10-CM | POA: Diagnosis not present

## 2018-11-20 DIAGNOSIS — K635 Polyp of colon: Secondary | ICD-10-CM | POA: Diagnosis not present

## 2018-11-20 DIAGNOSIS — D120 Benign neoplasm of cecum: Secondary | ICD-10-CM

## 2018-11-20 MED ORDER — SODIUM CHLORIDE 0.9 % IV SOLN: 500.0000 mL | Freq: Once | INTRAVENOUS | Status: DC

## 2018-11-20 NOTE — Patient Instructions (Signed)
Handouts provided:  Polyps  YOU HAD AN ENDOSCOPIC PROCEDURE TODAY AT THE [facility]:   Refer to the procedure report that was given to you for any specific questions about what was found during the examination.  If the procedure report does not answer your questions, please call your gastroenterologist to clarify.  If you requested that your care partner not be given the details of your procedure findings, then the procedure report has been included in a sealed envelope for you to review at your convenience later.  YOU SHOULD EXPECT: Some feelings of bloating in the abdomen. Passage of more gas than usual.  Walking can help get rid of the air that was put into your GI tract during the procedure and reduce the bloating. If you had a lower endoscopy (such as a colonoscopy or flexible sigmoidoscopy) you may notice spotting of blood in your stool or on the toilet paper. If you underwent a bowel prep for your procedure, you may not have a normal bowel movement for a few days.  Please Note:  You might notice some irritation and congestion in your nose or some drainage.  This is from the oxygen used during your procedure.  There is no need for concern and it should clear up in a day or so.  SYMPTOMS TO REPORT IMMEDIATELY:   Following lower endoscopy (colonoscopy or flexible sigmoidoscopy):  Excessive amounts of blood in the stool  Significant tenderness or worsening of abdominal pains  Swelling of the abdomen that is new, acute  Fever of 100F or higher  For urgent or emergent issues, a gastroenterologist can be reached at any hour by calling [phone].   DIET:  We do recommend a small meal at first, but then you may proceed to your regular diet.  Drink plenty of fluids but you should avoid alcoholic beverages for 24 hours.  ACTIVITY:  You should plan to take it easy for the rest of today and you should NOT DRIVE or use heavy machinery until tomorrow (because of the sedation  medicines used during the test).    FOLLOW UP: Our staff will call the number listed on your records the next business day following your procedure to check on you and address any questions or concerns that you may have regarding the information given to you following your procedure. If we do not reach you, we will leave a message.  However, if you are feeling well and you are not experiencing any problems, there is no need to return our call.  We will assume that you have returned to your regular daily activities without incident.  If any biopsies were taken you will be contacted by phone or by letter within the next 1-3 weeks.  Please call us at [phone] if you have not heard about the biopsies in 3 weeks.    SIGNATURES/CONFIDENTIALITY: You and/or your care partner have signed paperwork which will be entered into your electronic medical record.  These signatures attest to the fact that that the information above on your After Visit Summary has been reviewed and is understood.  Full responsibility of the confidentiality of this discharge information lies with you and/or your care-partner.  

## 2018-11-20 NOTE — Progress Notes (Signed)
Pt's states no medical or surgical changes since previsit or office visit. 

## 2018-11-20 NOTE — Progress Notes (Signed)
Called to room to assist during endoscopic procedure.  Patient ID and intended procedure confirmed with present staff. Received instructions for my participation in the procedure from the performing physician.  

## 2018-11-20 NOTE — Progress Notes (Signed)
Report to PACU, RN, vss, BBS= Clear.  

## 2018-11-21 ENCOUNTER — Telehealth: Payer: Self-pay | Admitting: *Deleted

## 2018-11-21 NOTE — Telephone Encounter (Signed)
  Follow up Call-  Call back number 11/20/2018  Post procedure Call Back phone  # [phone]  Permission to leave phone message Yes  Some recent data might be hidden     Patient questions:  Do you have a fever, pain , or abdominal swelling? No. Pain Score  0 *  Have you tolerated food without any problems? Yes.    Have you been able to return to your normal activities? Yes.    Do you have any questions about your discharge instructions: Diet   No. Medications  No. Follow up visit  No.  Do you have questions or concerns about your Care? No.  Actions: * If pain score is 4 or above: No action needed, pain <4.

## 2018-11-22 NOTE — Op Note (Signed)
[facility] Patient Name: Bowie Doiron Procedure Date: 11/20/2018 1:56 PM MRN: [id number] Endoscopist: Jerene Bears , MD Age: 57 Referring MD:  Date of Birth: 11-20-1961 Gender: Female Account #: [id number] Procedure:                Colonoscopy Indications:              High risk colon cancer surveillance: Personal                            history of adenoma less than 10 mm in size, Last                            colonoscopy: 2013 Medicines:                Monitored Anesthesia Care Procedure:                Pre-Anesthesia Assessment:                           - Prior to the procedure, a History and Physical                            was performed, and patient medications and                            allergies were reviewed. The patient's tolerance of                            previous anesthesia was also reviewed. The risks                            and benefits of the procedure and the sedation                            options and risks were discussed with the patient.                            All questions were answered, and informed consent                            was obtained. Prior Anticoagulants: The patient has                            taken no previous anticoagulant or antiplatelet                            agents. ASA Grade Assessment: II - A patient with                            mild systemic disease. After reviewing the risks                            and benefits, the patient was deemed in  satisfactory condition to undergo the procedure.                           After obtaining informed consent, the colonoscope                            was passed under direct vision. Throughout the                            procedure, the patient's blood pressure, pulse, and                            oxygen saturations were monitored continuously. The                            Model PCF-H190DL [id number]) scope was introduced                             through the anus and advanced to the cecum,                            identified by appendiceal orifice and ileocecal                            valve. The colonoscopy was performed without                            difficulty. The patient tolerated the procedure                            well. The quality of the bowel preparation was                            good. The ileocecal valve, appendiceal orifice, and                            rectum were photographed. Scope In: 2:08:07 PM Scope Out: 2:28:30 PM Scope Withdrawal Time: 0 hours 16 minutes 45 seconds  Total Procedure Duration: 0 hours 20 minutes 23 seconds  Findings:                 The digital rectal exam was normal.                           A 8 mm polyp was found in the cecum. The polyp was                            flat and covered with mucus cap. The polyp was                            removed with a cold snare. Resection and retrieval                            were complete.  Two sessile polyps with mucus caps were found in                            the ascending colon. The polyps were 3 to 7 mm in                            size. These polyps were removed with a cold snare.                            Resection and retrieval were complete.                           A 10 mm polyp with mucus cap was found in the                            transverse colon. The polyp was sessile. The polyp                            was removed with a cold snare. Resection and                            retrieval were complete.                           Internal hemorrhoids were found during                            retroflexion. The hemorrhoids were small.                           The exam was otherwise without abnormality. Complications:            No immediate complications. Estimated Blood Loss:     Estimated blood loss was minimal. Impression:               - One 8 mm polyp in  the cecum, removed with a cold                            snare. Resected and retrieved.                           - Two 3 to 7 mm polyps in the ascending colon,                            removed with a cold snare. Resected and retrieved.                           - One 10 mm polyp in the transverse colon, removed                            with a cold snare. Resected and retrieved.                           -  Internal hemorrhoids.                           - The examination was otherwise normal. Recommendation:           - Patient has a contact number available for                            emergencies. The signs and symptoms of potential                            delayed complications were discussed with the                            patient. Return to normal activities tomorrow.                            Written discharge instructions were provided to the                            patient.                           - Resume previous diet.                           - Continue present medications.                           - Await pathology results.                           - Repeat colonoscopy is recommended for                            surveillance. The colonoscopy date will be                            determined after pathology results from today's                            exam become available for review. Jerene Bears, MD 11/20/2018 2:32:47 PM This report has been signed electronically.

## 2018-11-26 ENCOUNTER — Encounter: Payer: Self-pay | Admitting: Internal Medicine

## 2019-11-19 ENCOUNTER — Other Ambulatory Visit: Payer: Self-pay | Admitting: Obstetrics and Gynecology

## 2019-11-19 DIAGNOSIS — Z1231 Encounter for screening mammogram for malignant neoplasm of breast: Secondary | ICD-10-CM

## 2020-01-09 ENCOUNTER — Ambulatory Visit: Payer: BLUE CROSS/BLUE SHIELD

## 2020-12-22 ENCOUNTER — Telehealth: Payer: Self-pay | Admitting: Internal Medicine

## 2020-12-22 NOTE — Telephone Encounter (Signed)
I will forward call to Thea Alken who schedules echo's, though not sure if she schedules stress test's.

## 2020-12-22 NOTE — Telephone Encounter (Signed)
Patient is following up regarding orders for a stress test and echocardiogram. She states her cardiologist with [facility] faxed the orders over on 12/17/20. I confirmed fax numbers she had were accurate and she stated she will contact [facility] to have orders resubmitted. Please contact the patient with any updates.

## 2020-12-23 NOTE — Telephone Encounter (Signed)
I called patient and had to leave a voicemail. I informed patient that we had not received orders and I gave them my fax number and office direct telephone number and asked patient to call me if she needed to confirm.  We will be glad to schedule testing once order is received.  Thank you.

## 2020-12-24 ENCOUNTER — Other Ambulatory Visit (HOSPITAL_COMMUNITY): Payer: Self-pay | Admitting: Cardiology

## 2020-12-24 DIAGNOSIS — R0789 Other chest pain: Secondary | ICD-10-CM

## 2021-01-04 ENCOUNTER — Inpatient Hospital Stay (HOSPITAL_COMMUNITY): Admission: RE | Admit: 2021-01-04 | Payer: BLUE CROSS/BLUE SHIELD | Source: Ambulatory Visit

## 2021-01-05 ENCOUNTER — Other Ambulatory Visit (HOSPITAL_COMMUNITY): Payer: 59

## 2021-01-06 ENCOUNTER — Other Ambulatory Visit: Payer: Self-pay | Admitting: Obstetrics and Gynecology

## 2021-01-06 DIAGNOSIS — Z1231 Encounter for screening mammogram for malignant neoplasm of breast: Secondary | ICD-10-CM

## 2021-01-07 ENCOUNTER — Other Ambulatory Visit (HOSPITAL_COMMUNITY): Payer: BLUE CROSS/BLUE SHIELD

## 2021-01-11 ENCOUNTER — Ambulatory Visit (INDEPENDENT_AMBULATORY_CARE_PROVIDER_SITE_OTHER): Payer: 59 | Admitting: Psychology

## 2021-01-11 DIAGNOSIS — F411 Generalized anxiety disorder: Secondary | ICD-10-CM

## 2021-01-13 ENCOUNTER — Other Ambulatory Visit: Payer: Self-pay

## 2021-01-13 ENCOUNTER — Ambulatory Visit (HOSPITAL_COMMUNITY): Payer: 59 | Attending: Internal Medicine

## 2021-01-13 DIAGNOSIS — R0789 Other chest pain: Secondary | ICD-10-CM | POA: Insufficient documentation

## 2021-01-13 LAB — ECHOCARDIOGRAM COMPLETE
Area-P 1/2: 3.63 cm2
S' Lateral: 2.6 cm

## 2021-01-15 ENCOUNTER — Other Ambulatory Visit: Payer: Self-pay

## 2021-01-15 ENCOUNTER — Encounter: Payer: Self-pay | Admitting: Gastroenterology

## 2021-01-15 ENCOUNTER — Ambulatory Visit (INDEPENDENT_AMBULATORY_CARE_PROVIDER_SITE_OTHER): Payer: 59 | Admitting: Gastroenterology

## 2021-01-15 VITALS — BP 96/64 | HR 72 | Ht 67.0 in | Wt 133.4 lb

## 2021-01-15 DIAGNOSIS — R0789 Other chest pain: Secondary | ICD-10-CM | POA: Insufficient documentation

## 2021-01-15 MED ORDER — PANTOPRAZOLE SODIUM 40 MG PO TBEC: 40.0000 mg | DELAYED_RELEASE_TABLET | Freq: Two times a day (BID) | ORAL | 5 refills | Status: DC

## 2021-01-15 NOTE — Progress Notes (Signed)
01/15/2021 Margaret Hoffman [id number] April 06, 1961   HISTORY OF PRESENT ILLNESS: This is a pleasant 60 year old female who is a patient of Dr. Vena Rua known him for colonoscopy.  She presents here today at the request of her PCP, Dr. Philip Aspen, for evaluation regarding GERD/atypical chest pain.  She says that this started back in November.  It is midsternal chest pressure that sometimes goes to the left.  She says that she has seen a cardiologist and had an echo that was normal.  She says that they do not think it is cardiac in origin.  She denies any burning sensation in her chest.  She reports maybe occasional indigestion, but nothing frequent.  She denies nausea, vomiting, abdominal pain.  She has been on pantoprazole 40 mg daily since December 1 and at this point feels that it has probably helped about 40 to 50%.  The pain is intermittent and is not dependent on activity, eating, drinking.  She denies any dysphagia or odynophagia.  Pain does wake her up at night on occasion.   Past Medical History:  Diagnosis Date  . Abnormal uterine bleeding (AUB)    uterine polyps  . Wears contact lenses    Past Surgical History:  Procedure Laterality Date  . BREAST BIOPSY     left breast/benign  . DILITATION & CURRETTAGE/HYSTROSCOPY WITH HYDROTHERMAL ABLATION N/A 02/11/2016   Procedure: DILATATION & CURETTAGE/HYSTEROSCOPY ;  Surgeon: Dian Queen, MD;  Location: [facility];  Service: Gynecology;  Laterality: N/A;  . HYSTEROSCOPY WITH D & C N/A 09/15/2014   Procedure: DILATATION AND CURETTAGE /HYSTEROSCOPY;  Surgeon: Cyril Mourning, MD;  Location: [facility];  Service: Gynecology;  Laterality: N/A;   endometrial polyp  . LAPAROSCOPIC VAGINAL HYSTERECTOMY WITH SALPINGECTOMY Bilateral 07/18/2018   Procedure: LAPAROSCOPIC ASSISTED VAGINAL HYSTERECTOMY WITH SALPINGECTOMY;  Surgeon: Dian Queen, MD;  Location: [facility];  Service: Gynecology;   Laterality: Bilateral;  vagina    reports that she has never smoked. She has never used smokeless tobacco. She reports current alcohol use. She reports that she does not use drugs. family history includes Colon cancer in her maternal grandmother; Healthy in her sister; Leukemia in her maternal grandmother; Other in her mother. Allergies  Allergen Reactions  . Codeine Nausea And Vomiting      Outpatient Encounter Medications as of 01/15/2021  Medication Sig  . ALPRAZolam (XANAX) 0.5 MG tablet Take 0.25 mg by mouth at bedtime as needed for sleep.   . Biotin 10 MG TABS Take by mouth daily.  . cholecalciferol (VITAMIN D3) 25 MCG (1000 UT) tablet Take 1,000 Units by mouth daily.  Marland Kitchen estradiol (VIVELLE-DOT) 0.0375 MG/24HR Place 1 patch onto the skin 2 (two) times a week.  . sertraline (ZOLOFT) 50 MG tablet Take 25 mg by mouth at bedtime.   . [DISCONTINUED] estradiol (CLIMARA - DOSED IN MG/24 HR) 0.0375 mg/24hr patch Place 0.0375 mg onto the skin 2 (two) times a week. Sunday & Wednesdays  . [DISCONTINUED] ibuprofen (ADVIL,MOTRIN) 600 MG tablet Take 1 tablet (600 mg total) by mouth every 6 (six) hours as needed (mild pain). (Patient not taking: Reported on 11/09/2018)  . [DISCONTINUED] phenazopyridine (PYRIDIUM) 200 MG tablet Take 1 tablet (200 mg total) by mouth 3 (three) times daily with meals. (Patient not taking: Reported on 11/09/2018)  . [DISCONTINUED] progesterone (PROMETRIUM) 200 MG capsule Take 200 mg by mouth See admin instructions. Day 1 to 12 of every 3rd month-  Last Cycle July  1st-12th   No facility-administered encounter medications on file as of 01/15/2021.     REVIEW OF SYSTEMS  : All other systems reviewed and negative except where noted in the History of Present Illness.   PHYSICAL EXAM: BP 96/64   Pulse 72   Ht 5\' 7"  (1.702 m)   Wt 133 lb 6.4 oz (60.5 kg)   LMP 12/25/2012   BMI 20.89 kg/m  General: Well developed white female in no acute distress Head: Normocephalic and  atraumatic Eyes:  Sclerae anicteric, conjunctiva pink. Ears: Normal auditory acuity Lungs: Clear throughout to auscultation; no W/R/R. Heart: Regular rate and rhythm; no M/R/G. Abdomen: Soft, non-distended.  BS present.  Non-tender. Musculoskeletal: Symmetrical with no gross deformities  Skin: No lesions on visible extremities Extremities: No edema  Neurological: Alert oriented x 4, grossly non-focal Psychological:  Alert and cooperative. Normal mood and affect  ASSESSMENT AND PLAN: *Atypical chest pain: Intermittent central to left chest discomfort.  Has seen cardiology with a normal echo and they do not think it is cardiac in origin.  Reports occasional indigestion, but nothing frequent.  Has noticed about 40 to 50% improvement on pantoprazole.  She does not really want to have EGD unless absolutely needed so agreed to esophagram instead.  We will proceed with that.  I am also go to increase her pantoprazole to twice daily.  New prescription sent to her pharmacy.  CC:  [name], MD

## 2021-01-15 NOTE — Patient Instructions (Signed)
If you are age 59 or older, your body mass index should be between 23-30. Your Body mass index is 20.89 kg/m. If this is out of the aforementioned range listed, please consider follow up with your Primary Care Provider.  If you are age 109 or younger, your body mass index should be between 19-25. Your Body mass index is 20.89 kg/m. If this is out of the aformentioned range listed, please consider follow up with your Primary Care Provider.   You have been scheduled for a Barium Esophogram at [facility] (1st floor of the hospital) on 01/29/21 at 10:30am. Please arrive 15 minutes prior to your appointment for registration. Make certain not to have anything to eat or drink 3 hours prior to your test. If you need to reschedule for any reason, please contact radiology at [phone] to do so. __________________________________________________________________ A barium swallow is an examination that concentrates on views of the esophagus. This tends to be a double contrast exam (barium and two liquids which, when combined, create a gas to distend the wall of the oesophagus) or single contrast (non-ionic iodine based). The study is usually tailored to your symptoms so a good history is essential. Attention is paid during the study to the form, structure and configuration of the esophagus, looking for functional disorders (such as aspiration, dysphagia, achalasia, motility and reflux) EXAMINATION You may be asked to change into a gown, depending on the type of swallow being performed. A radiologist and radiographer will perform the procedure. The radiologist will advise you of the type of contrast selected for your procedure and direct you during the exam. You will be asked to stand, sit or lie in several different positions and to hold a small amount of fluid in your mouth before being asked to swallow while the imaging is performed .In some instances you may be asked to swallow barium coated  marshmallows to assess the motility of a solid food bolus. The exam can be recorded as a digital or video fluoroscopy procedure. POST PROCEDURE It will take 1-2 days for the barium to pass through your system. To facilitate this, it is important, unless otherwise directed, to increase your fluids for the next 24-48hrs and to resume your normal diet.  This test typically takes about 30 minutes to perform. __________________________________________________________________________________  We have sent the following medications to your pharmacy for you to pick up at your convenience: Pantoprazole 40 mg  Due to recent changes in healthcare laws, you may see the results of your imaging and laboratory studies on MyChart before your provider has had a chance to review them.  We understand that in some cases there may be results that are confusing or concerning to you. Not all laboratory results come back in the same time frame and the provider may be waiting for multiple results in order to interpret others.  Please give [place] 48 hours in order for your provider to thoroughly review all the results before contacting the office for clarification of your results.   Thank you for choosing me and [facility].  Alonza Bogus, PA-C

## 2021-01-16 ENCOUNTER — Other Ambulatory Visit (HOSPITAL_COMMUNITY)
Admission: RE | Admit: 2021-01-16 | Discharge: 2021-01-16 | Disposition: A | Discharge status: Home / Self Care | Payer: 59 | Source: Ambulatory Visit | Attending: Cardiology | Admitting: Cardiology

## 2021-01-16 DIAGNOSIS — Z20822 Contact with and (suspected) exposure to covid-19: Secondary | ICD-10-CM | POA: Insufficient documentation

## 2021-01-16 DIAGNOSIS — Z01812 Encounter for preprocedural laboratory examination: Secondary | ICD-10-CM | POA: Diagnosis present

## 2021-01-16 LAB — SARS CORONAVIRUS 2 (TAT 6-24 HRS): SARS Coronavirus 2: NEGATIVE

## 2021-01-18 ENCOUNTER — Telehealth (HOSPITAL_COMMUNITY): Payer: Self-pay | Admitting: *Deleted

## 2021-01-18 ENCOUNTER — Ambulatory Visit (INDEPENDENT_AMBULATORY_CARE_PROVIDER_SITE_OTHER): Payer: 59 | Admitting: Psychology

## 2021-01-18 DIAGNOSIS — F411 Generalized anxiety disorder: Secondary | ICD-10-CM

## 2021-01-18 NOTE — Telephone Encounter (Signed)
Stress Echo instructions given to patient.  Margaret Hoffman

## 2021-01-20 ENCOUNTER — Ambulatory Visit (HOSPITAL_COMMUNITY): Payer: 59

## 2021-01-20 ENCOUNTER — Ambulatory Visit (HOSPITAL_COMMUNITY): Payer: 59 | Attending: Cardiology

## 2021-01-20 ENCOUNTER — Other Ambulatory Visit: Payer: Self-pay

## 2021-01-20 DIAGNOSIS — R0789 Other chest pain: Secondary | ICD-10-CM | POA: Insufficient documentation

## 2021-01-21 NOTE — Progress Notes (Signed)
Addendum: Reviewed and agree with assessment and management plan. Jakaylee Sasaki M, MD  

## 2021-01-22 ENCOUNTER — Other Ambulatory Visit: Payer: Self-pay

## 2021-01-22 ENCOUNTER — Ambulatory Visit
Admission: RE | Admit: 2021-01-22 | Discharge: 2021-01-22 | Disposition: A | Discharge status: Home / Self Care | Payer: BLUE CROSS/BLUE SHIELD | Source: Ambulatory Visit

## 2021-01-22 DIAGNOSIS — Z1231 Encounter for screening mammogram for malignant neoplasm of breast: Secondary | ICD-10-CM

## 2021-01-25 ENCOUNTER — Ambulatory Visit (INDEPENDENT_AMBULATORY_CARE_PROVIDER_SITE_OTHER): Payer: 59 | Admitting: Psychology

## 2021-01-25 DIAGNOSIS — F411 Generalized anxiety disorder: Secondary | ICD-10-CM | POA: Diagnosis not present

## 2021-01-29 ENCOUNTER — Ambulatory Visit (HOSPITAL_COMMUNITY): Payer: 59

## 2021-02-05 ENCOUNTER — Ambulatory Visit (HOSPITAL_COMMUNITY)
Admission: RE | Admit: 2021-02-05 | Discharge: 2021-02-05 | Disposition: A | Discharge status: Home / Self Care | Payer: 59 | Source: Ambulatory Visit | Attending: Gastroenterology | Admitting: Gastroenterology

## 2021-02-05 ENCOUNTER — Other Ambulatory Visit: Payer: Self-pay

## 2021-02-05 DIAGNOSIS — R0789 Other chest pain: Secondary | ICD-10-CM | POA: Insufficient documentation

## 2021-02-12 ENCOUNTER — Ambulatory Visit (HOSPITAL_COMMUNITY): Payer: 59

## 2021-02-22 ENCOUNTER — Ambulatory Visit (INDEPENDENT_AMBULATORY_CARE_PROVIDER_SITE_OTHER): Payer: 59 | Admitting: Psychology

## 2021-02-22 DIAGNOSIS — F411 Generalized anxiety disorder: Secondary | ICD-10-CM | POA: Diagnosis not present

## 2021-02-26 ENCOUNTER — Ambulatory Visit: Payer: 59 | Admitting: Psychology

## 2021-03-08 ENCOUNTER — Ambulatory Visit: Payer: 59 | Admitting: Psychology

## 2021-03-15 ENCOUNTER — Ambulatory Visit (INDEPENDENT_AMBULATORY_CARE_PROVIDER_SITE_OTHER): Payer: 59 | Admitting: Psychology

## 2021-03-15 DIAGNOSIS — F411 Generalized anxiety disorder: Secondary | ICD-10-CM | POA: Diagnosis not present

## 2021-03-22 ENCOUNTER — Ambulatory Visit: Payer: 59 | Admitting: Psychology

## 2021-04-05 ENCOUNTER — Ambulatory Visit (INDEPENDENT_AMBULATORY_CARE_PROVIDER_SITE_OTHER): Payer: 59 | Admitting: Psychology

## 2021-04-05 DIAGNOSIS — F411 Generalized anxiety disorder: Secondary | ICD-10-CM | POA: Diagnosis not present

## 2021-04-26 ENCOUNTER — Ambulatory Visit (INDEPENDENT_AMBULATORY_CARE_PROVIDER_SITE_OTHER): Payer: 59 | Admitting: Psychology

## 2021-04-26 DIAGNOSIS — F411 Generalized anxiety disorder: Secondary | ICD-10-CM | POA: Diagnosis not present

## 2021-05-08 ENCOUNTER — Ambulatory Visit: Payer: 59 | Attending: Adult Health

## 2021-05-08 ENCOUNTER — Other Ambulatory Visit: Payer: Self-pay

## 2021-05-08 DIAGNOSIS — M6281 Muscle weakness (generalized): Secondary | ICD-10-CM | POA: Diagnosis present

## 2021-05-08 DIAGNOSIS — M25562 Pain in left knee: Secondary | ICD-10-CM | POA: Insufficient documentation

## 2021-05-08 DIAGNOSIS — M222X2 Patellofemoral disorders, left knee: Secondary | ICD-10-CM | POA: Insufficient documentation

## 2021-05-08 NOTE — Therapy (Signed)
[place], [place], [address] Phone: [phone]   Fax:  [phone]  Physical Therapy Evaluation  Patient Details  Name: Margaret Hoffman MRN: [id number] Date of Birth: 1961-09-14 Referring Provider (PT): [name], MD   Encounter Date: 05/08/2021   PT End of Session - 05/08/21 1344    Visit Number 1    Number of Visits 7    Date for PT Re-Evaluation 07/03/21    PT Start Time 7893    PT Stop Time 1123    PT Time Calculation (min) 47 min    Activity Tolerance Patient tolerated treatment well    Behavior During Therapy [facility] for tasks assessed/performed           Past Medical History:  Diagnosis Date  . Abnormal uterine bleeding (AUB)    uterine polyps  . Wears contact lenses     Past Surgical History:  Procedure Laterality Date  . BREAST BIOPSY     left breast/benign  . DILITATION & CURRETTAGE/HYSTROSCOPY WITH HYDROTHERMAL ABLATION N/A 02/11/2016   Procedure: DILATATION & CURETTAGE/HYSTEROSCOPY ;  Surgeon: Dian Queen, MD;  Location: [place];  Service: Gynecology;  Laterality: N/A;  . HYSTEROSCOPY WITH D & C N/A 09/15/2014   Procedure: DILATATION AND CURETTAGE /HYSTEROSCOPY;  Surgeon: Cyril Mourning, MD;  Location: [place];  Service: Gynecology;  Laterality: N/A;   endometrial polyp  . LAPAROSCOPIC VAGINAL HYSTERECTOMY WITH SALPINGECTOMY Bilateral 07/18/2018   Procedure: LAPAROSCOPIC ASSISTED VAGINAL HYSTERECTOMY WITH SALPINGECTOMY;  Surgeon: Dian Queen, MD;  Location: [place];  Service: Gynecology;  Laterality: Bilateral;  vagina    There were no vitals filed for this visit.    Subjective Assessment - 05/08/21 1333    Subjective Pt reports the development of L knee pain most often noticed when dsc steps and it felt like her leg was going to give out. Pt is not currently experiencing pain. Pt likes to be active and would like  resolve the knee pain or manage it as best as possible    Limitations Other (comment)   steps   Diagnostic tests Received message form Dr. Shon Baton office stating her xrays showed mild arthritis of the knee joint and moderate narrowing of the patella.    Patient Stated Goals To be active with as little knee pain as possible and to be able to manage it as necessary    Currently in Pain? No/denies   not currently   Pain Score 6    dsdc steps   Pain Location Knee    Pain Orientation Left;Anterior    Pain Descriptors / Indicators Aching;Sharp    Pain Type Acute pain    Pain Radiating Towards NA    Pain Onset 1 to 4 weeks ago    Pain Frequency Intermittent    Aggravating Factors  Dsc steps    Pain Relieving Factors Rest, cold              [facility] PT Assessment - 05/08/21 0001      Assessment   Medical Diagnosis Pain in left knee    Referring Provider (PT) [name], MD    Onset Date/Surgical Date --   past month   Hand Dominance Right    Prior Therapy no      Precautions   Precautions None      Restrictions   Weight Bearing Restrictions No      Balance Screen   Has the  patient fallen in the past 6 months No    Has the patient had a decrease in activity level because of a fear of falling?  No    Is the patient reluctant to leave their home because of a fear of falling?  No      Home Ecologist residence    Home Access Stairs to enter    Entrance Stairs-Number of Steps 3    Entrance Stairs-Rails Right    Home Layout Two level    Alternate Level Stairs-Number of Steps 13    Alternate Level Stairs-Rails Right      Prior Function   Level of Independence Independent    Vocation Unemployed    Leisure walks, Research officer, political party,  and takes care of grandchildren      Cognition   Overall Cognitive Status Within Functional Limits for tasks assessed      Sensation   Light Touch Appears Intact      Posture/Postural Control   Posture  Comments L foot positioned in min supination in comparison to R due to 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   developed from prior injury     ROM / Strength   AROM / PROM / Strength AROM;Strength      AROM   Overall AROM Comments AROMs of the hips, knee and ankle were WNLs, except hip add c min decrease bilat      Strength   Strength Assessment Site Hip;Knee    Right/Left Hip Right;Left    Right Hip Extension 5/5    Right Hip External Rotation  5/5    Right Hip Internal Rotation 5/5    Right Hip ABduction 5/5    Right Hip ADduction 5/5    Left Hip Flexion 4+/5    Left Hip Extension 4+/5    Left Hip External Rotation 4+/5    Left Hip Internal Rotation 4+/5    Left Hip ABduction 4+/5    Left Hip ADduction 4+/5    Right/Left Knee Right;Left    Right Knee Flexion 5/5    Right Knee Extension 5/5    Left Knee Flexion 4+/5    Left Knee Extension 4+/5      Palpation   Patella mobility WNLs    Palpation comment TTP to the distal patella and patelar tendon junction. Crepitus noted with each knee c extension      Special Tests    Special Tests Knee Special Tests;Laxity/Instability Tests    Laxity/Instability  Anterior drawer test;Posterior drawer test    Knee Special tests  McConnell Test;Patellofemoral Apprehension Test;Patellofemoral Grind Test (Clarke's Sign)      Anterior drawer test   Findings Negative    Side Left      Posterior drawer test   Findings Negative    Side  Left      McConnell Test   Findings Negative    Side Left      Patellofemoral Apprehension Test    Findings Positive    Side  Left      Patellofemoral Grind test (Clark's Sign)   Findings Postive    Side  Left    Comments medila patella      Transfers   Transfers Sit to Stand;Stand to Sit    Sit to Stand 7: Independent      Ambulation/Gait   Ambulation/Gait Yes    Ambulation/Gait Assistance 7: Independent  Objective measurements completed on examination:  See above findings.               PT Education - 05/08/21 1342    Education Details Eval findings, POC, HEP, RICE for symptom management, switch suport side when hold 6 month grandchild in standing for extended periods, method to dsc/asc steps to reduce strain on the L knee.    Person(s) Educated Patient    Methods Explanation;Demonstration;Tactile cues;Verbal cues;Handout    Comprehension Verbalized understanding;Verbal cues required;Returned demonstration;Tactile cues required            PT Short Term Goals - 05/08/21 1358      PT SHORT TERM GOAL #1   Title Pt will be Ind in an initial HEP    Baseline started on eval    Status New    Target Date 05/29/21      PT SHORT TERM GOAL #2   Title Pt will voice understanding of measure to assist in the management of the L knee pain    Status New    Target Date 05/29/21             PT Long Term Goals - 05/08/21 1400      PT LONG TERM GOAL #1   Title Pt will demonstrate 5/5 strength fot the L LE to assist in reduction of L knee pain and improve function    Baseline 4+/5    Target Date 06/26/21      PT LONG TERM GOAL #2   Title Pt will be able to asc/dsc steps in a step over step pattern 90% of the time without experiencing L knee pain    Status New    Target Date 06/26/21      PT LONG TERM GOAL #3   Title Pt will be Ind in a final HEP to maintain or progress achieved LOF    Status New    Target Date 06/26/21                  Plan - 05/08/21 1345    Clinical Impression Statement Pt presents to PT c L knee pain which has been an issue for approx 1 month, especially when dsc steps. Pt's signs and symptoms appear consistent with patellofemoral pain. Contributing factors are min L LE weakness in comparison to the R, L knee OA, and an enlarged PIP of the great toe which positions the L foot into supination. Pt was initially provided strengthneing exs for the L LE. Pt will benefit from PT 1w6 for strengthening,  modalities, and manual techniques to decrease pain and optimize function of the L knee.    Personal Factors and Comorbidities Comorbidity 1    Comorbidities OA L knee    Examination-Activity Limitations Locomotion Level;Stairs    Examination-Participation Restrictions Other   ADls   Stability/Clinical Decision Making Stable/Uncomplicated    Clinical Decision Making Low    Rehab Potential Excellent    PT Frequency 1x / week    PT Duration 6 weeks    PT Treatment/Interventions Cryotherapy;Electrical Stimulation;Iontophoresis 4mg /ml Dexamethasone;Moist Heat;Ultrasound;Therapeutic exercise;Therapeutic activities;Stair training;Gait training;Patient/family education;Manual techniques;Joint Manipulations;Dry needling;Taping;Vasopneumatic Device    PT Next Visit Plan Assess response to HEP. Assess gait pattern    PT Home Exercise Plan [id number]    Consulted and Agree with Plan of Care Patient           Patient will benefit from skilled therapeutic intervention in order to improve the following deficits and impairments:  Pain,Decreased strength,Postural  dysfunction  Visit Diagnosis: Acute pain of left knee  Patellofemoral disorder of left knee  Muscle weakness (generalized)     Problem List Patient Active Problem List   Diagnosis Date Noted  . Atypical chest pain 01/15/2021  . Postmenopausal bleeding 07/18/2018    Gar Ponto MS, PT 05/08/21 2:06 PM  [facility] [address] [place], [place], [address] Phone: [phone]   Fax:  [phone]  Name: JAMEYA PONTIFF MRN: [id number] Date of Birth: [birth year]

## 2021-05-10 ENCOUNTER — Ambulatory Visit (INDEPENDENT_AMBULATORY_CARE_PROVIDER_SITE_OTHER): Payer: 59 | Admitting: Psychology

## 2021-05-10 DIAGNOSIS — F411 Generalized anxiety disorder: Secondary | ICD-10-CM

## 2021-05-12 ENCOUNTER — Ambulatory Visit: Payer: 59 | Admitting: Physical Therapy

## 2021-05-21 ENCOUNTER — Ambulatory Visit: Payer: 59

## 2021-05-28 ENCOUNTER — Encounter: Payer: 59 | Admitting: Physical Therapy

## 2021-11-23 ENCOUNTER — Encounter: Payer: Self-pay | Admitting: Internal Medicine

## 2021-12-01 ENCOUNTER — Telehealth: Payer: Self-pay

## 2021-12-01 NOTE — Telephone Encounter (Signed)
SCANNED REFERRAL TO REFERRAL....Marland KitchenNNOTES IN CARE EVERYWHERE

## 2021-12-02 ENCOUNTER — Encounter: Payer: Self-pay | Admitting: Cardiology

## 2021-12-02 ENCOUNTER — Ambulatory Visit (INDEPENDENT_AMBULATORY_CARE_PROVIDER_SITE_OTHER): Payer: 59 | Admitting: Cardiology

## 2021-12-02 ENCOUNTER — Other Ambulatory Visit: Payer: Self-pay

## 2021-12-02 ENCOUNTER — Ambulatory Visit (INDEPENDENT_AMBULATORY_CARE_PROVIDER_SITE_OTHER): Payer: 59

## 2021-12-02 VITALS — BP 100/64 | HR 68 | Resp 20 | Ht 67.0 in | Wt 132.0 lb

## 2021-12-02 DIAGNOSIS — I493 Ventricular premature depolarization: Secondary | ICD-10-CM

## 2021-12-02 NOTE — Progress Notes (Signed)
Cardiology Office Note:    Date:  12/02/2021   ID:  Margaret Hoffman, DOB [birth year], MRN [id number]  PCP:  [name], MD  Cardiologist:  Berniece Salines, DO  Electrophysiologist:  None   Referring MD: Caroleen Hamman*   " I am having PVCs"  History of Present Illness:    Margaret Hoffman is a 60 y.o. female with a hx of abnormal uterine bleeding, GERD, recently diagnosed PVCs is here today to be evaluated for palpitations.  In December 2021 the patient was seen at [facility] at that time she was experiencing chest discomfort.  During that visit a stress echo was ordered as well as a regular echocardiogram all of which was normal.  Since her initial work-up she has been doing well from a cardiovascular standpoint.  Per the patient recently she has been experiencing increasing palpitations and fluttering feeling.  She did get the mobile Kardia which showed that she has been having PVCs.  No chest pain   Past Medical History:  Diagnosis Date   Abnormal uterine bleeding (AUB)    uterine polyps   Wears contact lenses     Past Surgical History:  Procedure Laterality Date   BREAST BIOPSY     left breast/benign   DILITATION & CURRETTAGE/HYSTROSCOPY WITH HYDROTHERMAL ABLATION N/A 02/11/2016   Procedure: DILATATION & CURETTAGE/HYSTEROSCOPY ;  Surgeon: Dian Queen, MD;  Location: [place];  Service: Gynecology;  Laterality: N/A;   HYSTEROSCOPY WITH D & C N/A 09/15/2014   Procedure: DILATATION AND CURETTAGE /HYSTEROSCOPY;  Surgeon: Cyril Mourning, MD;  Location: [place];  Service: Gynecology;  Laterality: N/A;   endometrial polyp   LAPAROSCOPIC VAGINAL HYSTERECTOMY WITH SALPINGECTOMY Bilateral 07/18/2018   Procedure: LAPAROSCOPIC ASSISTED VAGINAL HYSTERECTOMY WITH SALPINGECTOMY;  Surgeon: Dian Queen, MD;  Location: [place];  Service: Gynecology;  Laterality: Bilateral;  vagina    Current Medications: Current  Meds  Medication Sig   ALPRAZolam (XANAX) 0.5 MG tablet Take 0.25 mg by mouth at bedtime as needed for sleep.    Biotin 10 MG TABS Take by mouth daily.   cholecalciferol (VITAMIN D3) 25 MCG (1000 UT) tablet Take 1,000 Units by mouth daily.   estradiol (VIVELLE-DOT) 0.0375 MG/24HR Place 1 patch onto the skin 2 (two) times a week.   propranolol (INDERAL) 10 MG tablet Take 10 mg by mouth daily. TAKING 1-2 TIMES DAILY   sertraline (ZOLOFT) 100 MG tablet Take 100 mg by mouth daily.     Allergies:   Codeine   Social History   Socioeconomic History   Marital status: Married    Spouse name: Not on file   Number of children: Not on file   Years of education: Not on file   Highest education level: Not on file  Occupational History   Not on file  Tobacco Use   Smoking status: Never   Smokeless tobacco: Never  Vaping Use   Vaping Use: Never used  Substance and Sexual Activity   Alcohol use: Yes    Comment: rare   Drug use: No   Sexual activity: Not on file  Other Topics Concern   Not on file  Social History Narrative   Not on file   Social Determinants of Health   Financial Resource Strain: Not on file  Food Insecurity: Not on file  Transportation Needs: Not on file  Physical Activity: Not on file  Stress: Not on file  Social Connections: Not on file  Family History: The patient's family history includes Colon cancer in her maternal grandmother; Healthy in her sister; Leukemia in her maternal grandmother; Other in her mother. There is no history of Breast cancer, Esophageal cancer, or Pancreatic cancer.  ROS:   Review of Systems  Constitution: Negative for decreased appetite, fever and weight gain.  HENT: Negative for congestion, ear discharge, hoarse voice and sore throat.   Eyes: Negative for discharge, redness, vision loss in right eye and visual halos.  Cardiovascular: Report palpitation.  Negative for chest pain, dyspnea on exertion, leg swelling, orthopnea.   Respiratory: Negative for cough, hemoptysis, shortness of breath and snoring.   Endocrine: Negative for heat intolerance and polyphagia.  Hematologic/Lymphatic: Negative for bleeding problem. Does not bruise/bleed easily.  Skin: Negative for flushing, nail changes, rash and suspicious lesions.  Musculoskeletal: Negative for arthritis, joint pain, muscle cramps, myalgias, neck pain and stiffness.  Gastrointestinal: Negative for abdominal pain, bowel incontinence, diarrhea and excessive appetite.  Genitourinary: Negative for decreased libido, genital sores and incomplete emptying.  Neurological: Negative for brief paralysis, focal weakness, headaches and loss of balance.  Psychiatric/Behavioral: Negative for altered mental status, depression and suicidal ideas.  Allergic/Immunologic: Negative for HIV exposure and persistent infections.    EKGs/Labs/Other Studies Reviewed:    The following studies were reviewed today:   EKG:  The ekg ordered today demonstrates sinus rhythm, heart rate 61 bpm.  TTE 01/13/2021 IMPRESSIONS   1. Left ventricular ejection fraction, by estimation, is 60 to 65%. Left  ventricular ejection fraction by 3D volume is 64 %. The left ventricle has  normal function. The left ventricle has no regional wall motion  abnormalities. Left ventricular diastolic   parameters were normal.   2. Right ventricular systolic function is normal. The right ventricular  size is normal. There is normal pulmonary artery systolic pressure.   3. Left atrial size was mildly dilated.   4. The mitral valve is normal in structure. No evidence of mitral valve  regurgitation. No evidence of mitral stenosis.   5. The aortic valve is tricuspid. Aortic valve regurgitation is not  visualized. No aortic stenosis is present.   6. The inferior vena cava is normal in size with greater than 50%  respiratory variability, suggesting right atrial pressure of 3 mmHg.   Comparison(s): No prior  Echocardiogram.   Conclusion(s)/Recommendation(s): Normal biventricular function without  evidence of hemodynamically significant valvular heart disease.   FINDINGS   Left Ventricle: Left ventricular ejection fraction, by estimation, is 60  to 65%. Left ventricular ejection fraction by 3D volume is 64 %. The left  ventricle has normal function. The left ventricle has no regional wall  motion abnormalities. The left  ventricular internal cavity size was normal in size. There is no left  ventricular hypertrophy. Left ventricular diastolic parameters were  normal.   Right Ventricle: The right ventricular size is normal. No increase in  right ventricular wall thickness. Right ventricular systolic function is  normal. There is normal pulmonary artery systolic pressure. The tricuspid  regurgitant velocity is 2.24 m/s, and   with an assumed right atrial pressure of 3 mmHg, the estimated right  ventricular systolic pressure is 58.8 mmHg.   Left Atrium: Left atrial size was mildly dilated.   Right Atrium: Right atrial size was normal in size.   Pericardium: There is no evidence of pericardial effusion.   Mitral Valve: The mitral valve is normal in structure. No evidence of  mitral valve regurgitation. No evidence of mitral valve stenosis.  Tricuspid Valve: The tricuspid valve is normal in structure. Tricuspid  valve regurgitation is trivial.   Aortic Valve: The aortic valve is tricuspid. Aortic valve regurgitation is  not visualized. No aortic stenosis is present.   Pulmonic Valve: The pulmonic valve was not well visualized. Pulmonic valve  regurgitation is not visualized. No evidence of pulmonic stenosis.   Aorta: The aortic root, ascending aorta and aortic arch are all  structurally normal, with no evidence of dilitation or obstruction.   Venous: The inferior vena cava is normal in size with greater than 50%  respiratory variability, suggesting right atrial pressure of 3 mmHg.    IAS/Shunts: The atrial septum is grossly normal  Stress echo 01/20/2021 IMPRESSIONS  1. This is a negative stress echocardiogram for ischemia.   2. This is a low risk study.   FINDINGS   Exam Protocol: The patient exercised on a treadmill according to a Bruce  protocol.      Patient Performance: The patient exercised for 7 minutes and 10 seconds,  achieving 9 METS. The maximum stage achieved was III of the Bruce  protocol. The heart rate at peak stress was 166 bpm. The target heart rate  was calculated to be 137 bpm. The  percentage of maximum predicted heart rate achieved was 102.8 %. The  baseline blood pressure was 123/84 mmHg. The blood pressure at peak stress  was 135/63 mmHg. The blood pressure response was normal. The patient  developed chest pain during the stress  exam. The symptoms resolved with rest. The patient's functional capacity  was average.     EKG: Resting EKG showed normal sinus rhythm with occasional premature  ventricular contractions. The patient developed occasional premature  ventricular contractions during exercise. No ischemic changes.      2D Echo Findings: The baseline ejection fraction was 60%. Baseline  regional wall motion abnormalities were not present. There were no  stress-induced wall motion abnormalities. This is a negative stress  echocardiogram for ischemia.      Loralie Champagne MD  Electronically signed on 01/20/2021 at 5:05:23 PM   Recent Labs: No results found for requested labs within last 8760 hours.  Recent Lipid Panel No results found for: CHOL, TRIG, HDL, CHOLHDL, VLDL, LDLCALC, LDLDIRECT  Physical Exam:    VS:  BP 100/64 (BP Location: Left Arm, Patient Position: Sitting, Cuff Size: Normal)    Pulse 68    Resp 20    Ht 5\' 7"  (1.702 m)    Wt 132 lb (59.9 kg)    LMP 12/25/2012    SpO2 98%    BMI 20.67 kg/m     Wt Readings from Last 3 Encounters:  12/02/21 132 lb (59.9 kg)  01/15/21 133 lb 6.4 oz (60.5 kg)  11/20/18 137 lb  (62.1 kg)     GEN: Well nourished, well developed in no acute distress HEENT: Normal NECK: No JVD; No carotid bruits LYMPHATICS: No lymphadenopathy CARDIAC: S1S2 noted,RRR, no murmurs, rubs, gallops RESPIRATORY:  Clear to auscultation without rales, wheezing or rhonchi  ABDOMEN: Soft, non-tender, non-distended, +bowel sounds, no guarding. EXTREMITIES: No edema, No cyanosis, no clubbing MUSCULOSKELETAL:  No deformity  SKIN: Warm and dry NEUROLOGIC:  Alert and oriented x 3, non-focal PSYCHIATRIC:  Normal affect, good insight  ASSESSMENT:    1. PVC (premature ventricular contraction)    PLAN:     I reviewed her mobile Kardia and the patient does have PVCs.  At this time we will just try to figure out her PVC  burden.  We discussed I will place the ZIO monitor on the patient for 14 days to understand this.  In addition her PCP has started her on propanolol I am in agreement with this.  Despite the propranolol she still does have some symptoms.  The patient is in agreement with the above plan. The patient left the office in stable condition.  The patient will follow up as needed.   Medication Adjustments/Labs and Tests Ordered: Current medicines are reviewed at length with the patient today.  Concerns regarding medicines are outlined above.  Orders Placed This Encounter  Procedures   LONG TERM MONITOR (3-14 DAYS)   EKG 12-Lead   No orders of the defined types were placed in this encounter.   Patient Instructions  Medication Instructions:  Your physician recommends that you continue on your current medications as directed. Please refer to the Current Medication list given to you today.  *If you need a refill on your cardiac medications before your next appointment, please call your pharmacy*   Lab Work: None If you have labs (blood work) drawn today and your tests are completely normal, you will receive your results only by: [place] (if you have MyChart) OR A paper  copy in the mail If you have any lab test that is abnormal or we need to change your treatment, we will call you to review the results.   Testing/Procedures: [place]n Gulling- Long Term Monitor Instructions  Your physician has requested you wear a ZIO patch monitor for 14 days.  This is a single patch monitor. [employer] supplies one patch monitor per enrollment. Additional stickers are not available. Please do not apply patch if you will be having a Nuclear Stress Test,  Echocardiogram, Cardiac CT, MRI, or Chest Xray during the period you would be wearing the  monitor. The patch cannot be worn during these tests. You cannot remove and re-apply the  ZIO XT patch monitor.  Your ZIO patch monitor will be mailed 3 day USPS to your address on file. It may take 3-5 days  to receive your monitor after you have been enrolled.  Once you have received your monitor, please review the enclosed instructions. Your monitor  has already been registered assigning a specific monitor serial # to you.  Billing and Patient Assistance Program Information  We have supplied [employer] with any of your insurance information on file for billing purposes. [employer] offers a sliding scale Patient Assistance Program for patients that do not have  insurance, or whose insurance does not completely cover the cost of the ZIO monitor.  You must apply for the Patient Assistance Program to qualify for this discounted rate.  To apply, please call [employer] at [phone], select option 4, select option 2, ask to apply for  Patient Assistance Program. Theodore Demark will ask your household income, and how many people  are in your household. They will quote your out-of-pocket cost based on that information.  [employer] will also be able to set up a 86-month, interest-free payment plan if needed.  Applying the monitor   Shave hair from upper left chest.  Hold abrader disc by orange tab. Rub abrader in 40 strokes over the upper left chest as   indicated in your monitor instructions.  Clean area with 4 enclosed alcohol pads. Let dry.  Apply patch as indicated in monitor instructions. Patch will be placed under collarbone on left  side of chest with arrow pointing upward.  Rub patch adhesive wings for 2 minutes. Remove  white label marked "1". Remove the white  label marked "2". Rub patch adhesive wings for 2 additional minutes.  While looking in a mirror, press and release button in center of patch. A small green light will  flash 3-4 times. This will be your only indicator that the monitor has been turned on.  Do not shower for the first 24 hours. You may shower after the first 24 hours.  Press the button if you feel a symptom. You will hear a small click. Record Date, Time and  Symptom in the Patient Logbook.  When you are ready to remove the patch, follow instructions on the last 2 pages of Patient  Logbook. Stick patch monitor onto the last page of Patient Logbook.  Place Patient Logbook in the blue and white box. Use locking tab on box and tape box closed  securely. The blue and white box has prepaid postage on it. Please place it in the mailbox as  soon as possible. Your physician should have your test results approximately 7 days after the  monitor has been mailed back to [facility].  Call [place] at [phone] if you have questions regarding  your ZIO XT patch monitor. Call them immediately if you see an orange light blinking on your  monitor.  If your monitor falls off in less than 4 days, contact our Monitor department at [phone].  If your monitor becomes loose or falls off after 4 days call [employer] at [phone] for  suggestions on securing your monitor   Follow-Up: At [facility], you and your health needs are our priority.  As part of our continuing mission to provide you with exceptional heart care, we have created designated Provider Care Teams.  These Care Teams include your  primary Cardiologist (physician) and Advanced Practice Providers (APPs -  Physician Assistants and Nurse Practitioners) who all work together to provide you with the care you need, when you need it.  We recommend signing up for the patient portal called "MyChart".  Sign up information is provided on this After Visit Summary.  MyChart is used to connect with patients for Virtual Visits (Telemedicine).  Patients are able to view lab/test results, encounter notes, upcoming appointments, etc.  Non-urgent messages can be sent to your provider as well.   To learn more about what you can do with MyChart, go to [email or web address].    Your next appointment:    As needed  The format for your next appointment:   In Person  Provider:   Berniece Salines, DO   Other Instructions     Adopting a Healthy Lifestyle.  Know what a healthy weight is for you (roughly BMI <25) and aim to maintain this   Aim for 7+ servings of fruits and vegetables daily   65-80+ fluid ounces of water or unsweet tea for healthy kidneys   Limit to max 1 drink of alcohol per day; avoid smoking/tobacco   Limit animal fats in diet for cholesterol and heart health - choose grass fed whenever available   Avoid highly processed foods, and foods high in saturated/trans fats   Aim for low stress - take time to unwind and care for your mental health   Aim for 150 min of moderate intensity exercise weekly for heart health, and weights twice weekly for bone health   Aim for 7-9 hours of sleep daily   When it comes to diets, agreement about the perfect plan isnt easy to find, even among the experts.  Experts at the [employer] developed an idea known as the Healthy Eating Plate. Just imagine a plate divided into logical, healthy portions.   The emphasis is on diet quality:   Load up on vegetables and fruits - one-half of your plate: Aim for color and variety, and remember that potatoes dont count.   Go  for whole grains - one-quarter of your plate: Whole wheat, barley, wheat berries, quinoa, oats, brown rice, and foods made with them. If you want pasta, go with whole wheat pasta.   Protein power - one-quarter of your plate: Fish, chicken, beans, and nuts are all healthy, versatile protein sources. Limit red meat.   The diet, however, does go beyond the plate, offering a few other suggestions.   Use healthy plant oils, such as olive, canola, soy, corn, sunflower and peanut. Check the labels, and avoid partially hydrogenated oil, which have unhealthy trans fats.   If youre thirsty, drink water. Coffee and tea are good in moderation, but skip sugary drinks and limit milk and dairy products to one or two daily servings.   The type of carbohydrate in the diet is more important than the amount. Some sources of carbohydrates, such as vegetables, fruits, whole grains, and beans-are healthier than others.   Finally, stay active  Signed, Berniece Salines, DO  12/02/2021 3:25 PM    [facility]

## 2021-12-02 NOTE — Patient Instructions (Addendum)
Medication Instructions:  Your physician recommends that you continue on your current medications as directed. Please refer to the Current Medication list given to you today.  *If you need a refill on your cardiac medications before your next appointment, please call your pharmacy*   Lab Work: None If you have labs (blood work) drawn today and your tests are completely normal, you will receive your results only by: [place] (if you have MyChart) OR A paper copy in the mail If you have any lab test that is abnormal or we need to change your treatment, we will call you to review the results.   Testing/Procedures: Bryn Gulling- Long Term Monitor Instructions  Your physician has requested you wear a ZIO patch monitor for 14 days.  This is a single patch monitor. [employer] supplies one patch monitor per enrollment. Additional stickers are not available. Please do not apply patch if you will be having a Nuclear Stress Test,  Echocardiogram, Cardiac CT, MRI, or Chest Xray during the period you would be wearing the  monitor. The patch cannot be worn during these tests. You cannot remove and re-apply the  ZIO XT patch monitor.  Your ZIO patch monitor will be mailed 3 day USPS to your address on file. It may take 3-5 days  to receive your monitor after you have been enrolled.  Once you have received your monitor, please review the enclosed instructions. Your monitor  has already been registered assigning a specific monitor serial # to you.  Billing and Patient Assistance Program Information  We have supplied [employer] with any of your insurance information on file for billing purposes. [employer] offers a sliding scale Patient Assistance Program for patients that do not have  insurance, or whose insurance does not completely cover the cost of the ZIO monitor.  You must apply for the Patient Assistance Program to qualify for this discounted rate.  To apply, please call [employer] at [phone], select  option 4, select option 2, ask to apply for  Patient Assistance Program. Theodore Demark will ask your household income, and how many people  are in your household. They will quote your out-of-pocket cost based on that information.  [employer] will also be able to set up a 32-month, interest-free payment plan if needed.  Applying the monitor   Shave hair from upper left chest.  Hold abrader disc by orange tab. Rub abrader in 40 strokes over the upper left chest as  indicated in your monitor instructions.  Clean area with 4 enclosed alcohol pads. Let dry.  Apply patch as indicated in monitor instructions. Patch will be placed under collarbone on left  side of chest with arrow pointing upward.  Rub patch adhesive wings for 2 minutes. Remove white label marked "1". Remove the white  label marked "2". Rub patch adhesive wings for 2 additional minutes.  While looking in a mirror, press and release button in center of patch. A small green light will  flash 3-4 times. This will be your only indicator that the monitor has been turned on.  Do not shower for the first 24 hours. You may shower after the first 24 hours.  Press the button if you feel a symptom. You will hear a small click. Record Date, Time and  Symptom in the Patient Logbook.  When you are ready to remove the patch, follow instructions on the last 2 pages of Patient  Logbook. Stick patch monitor onto the last page of Patient Logbook.  Place Patient Logbook in  the blue and white box. Use locking tab on box and tape box closed  securely. The blue and white box has prepaid postage on it. Please place it in the mailbox as  soon as possible. Your physician should have your test results approximately 7 days after the  monitor has been mailed back to [facility].  Call [place] at [phone] if you have questions regarding  your ZIO XT patch monitor. Call them immediately if you see an orange light blinking on your  monitor.   If your monitor falls off in less than 4 days, contact our Monitor department at [phone].  If your monitor becomes loose or falls off after 4 days call [employer] at [phone] for  suggestions on securing your monitor   Follow-Up: At [facility], you and your health needs are our priority.  As part of our continuing mission to provide you with exceptional heart care, we have created designated Provider Care Teams.  These Care Teams include your primary Cardiologist (physician) and Advanced Practice Providers (APPs -  Physician Assistants and Nurse Practitioners) who all work together to provide you with the care you need, when you need it.  We recommend signing up for the patient portal called "MyChart".  Sign up information is provided on this After Visit Summary.  MyChart is used to connect with patients for Virtual Visits (Telemedicine).  Patients are able to view lab/test results, encounter notes, upcoming appointments, etc.  Non-urgent messages can be sent to your provider as well.   To learn more about what you can do with MyChart, go to [email or web address].    Your next appointment:    As needed  The format for your next appointment:   In Person  Provider:   Berniece Salines, DO   Other Instructions

## 2021-12-02 NOTE — Progress Notes (Unsigned)
Enrolled for [employer] to mail a ZIO XT long term holter monitor to the patients address on file.  

## 2021-12-16 DIAGNOSIS — I493 Ventricular premature depolarization: Secondary | ICD-10-CM | POA: Diagnosis not present

## 2021-12-22 ENCOUNTER — Other Ambulatory Visit: Payer: Self-pay | Admitting: Nurse Practitioner

## 2021-12-22 DIAGNOSIS — N6320 Unspecified lump in the left breast, unspecified quadrant: Secondary | ICD-10-CM | POA: Diagnosis not present

## 2022-01-06 ENCOUNTER — Encounter: Payer: Self-pay | Admitting: Cardiology

## 2022-01-07 NOTE — Telephone Encounter (Signed)
Sent message to patient , awaiting for results to be reviewed

## 2022-01-24 ENCOUNTER — Ambulatory Visit
Admission: RE | Admit: 2022-01-24 | Discharge: 2022-01-24 | Disposition: A | Discharge status: Home / Self Care | Payer: Self-pay | Source: Ambulatory Visit | Attending: Nurse Practitioner | Admitting: Nurse Practitioner

## 2022-01-24 ENCOUNTER — Other Ambulatory Visit: Payer: Self-pay

## 2022-01-24 ENCOUNTER — Ambulatory Visit
Admission: RE | Admit: 2022-01-24 | Discharge: 2022-01-24 | Disposition: A | Discharge status: Home / Self Care | Payer: 59 | Source: Ambulatory Visit | Attending: Nurse Practitioner | Admitting: Nurse Practitioner

## 2022-01-24 DIAGNOSIS — N6489 Other specified disorders of breast: Secondary | ICD-10-CM | POA: Diagnosis not present

## 2022-01-24 DIAGNOSIS — R922 Inconclusive mammogram: Secondary | ICD-10-CM | POA: Diagnosis not present

## 2022-01-24 DIAGNOSIS — N6320 Unspecified lump in the left breast, unspecified quadrant: Secondary | ICD-10-CM

## 2022-01-24 IMAGING — MG DIGITAL DIAGNOSTIC BILAT W/ TOMO W/ CAD
6 of 12 series · 6 of 36 positions shown · non-contrast
Comparison: Previous exam(s).

CLINICAL DATA: 60-year-old female with a palpable left axillary
lump.

EXAM:
DIGITAL DIAGNOSTIC BILATERAL MAMMOGRAM WITH TOMOSYNTHESIS AND CAD;
US AXILLARY LEFT
TECHNIQUE: Bilateral digital diagnostic mammography and breast tomosynthesis
was performed. The images were evaluated with computer-aided
detection.; Targeted ultrasound examination of the left axilla was
performed.

[R CC synth-2D]
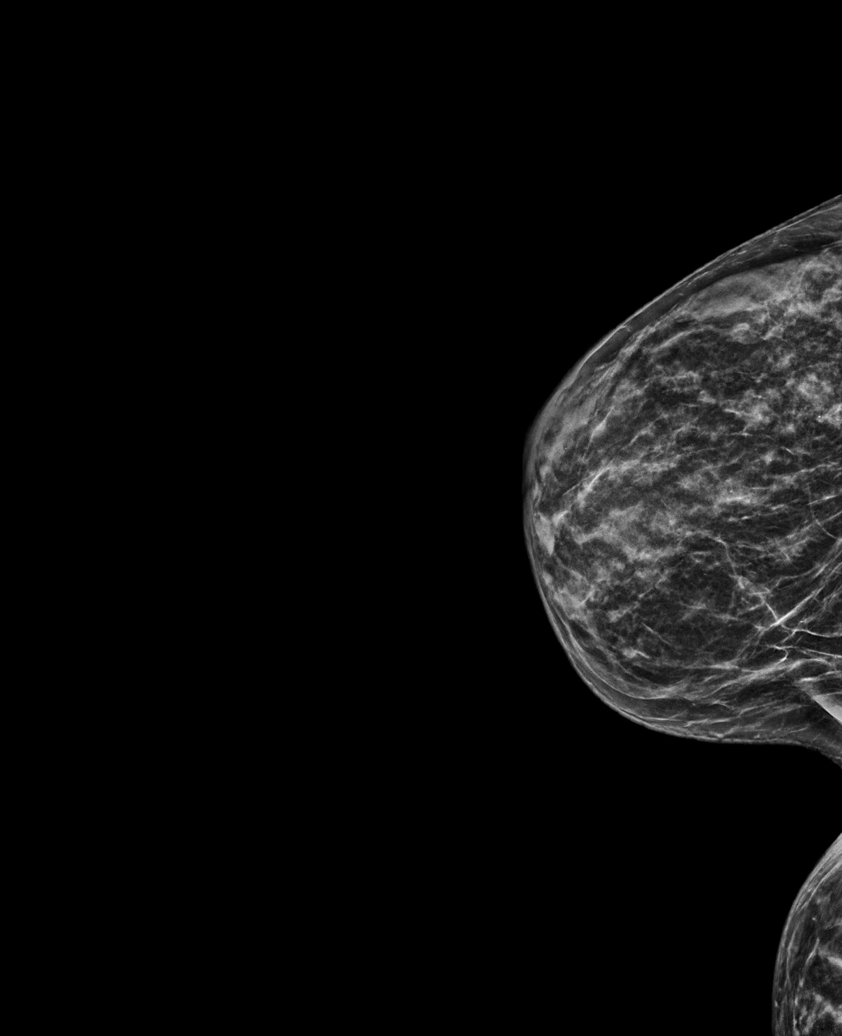

[L MLO synth-2D (1 of 2)]
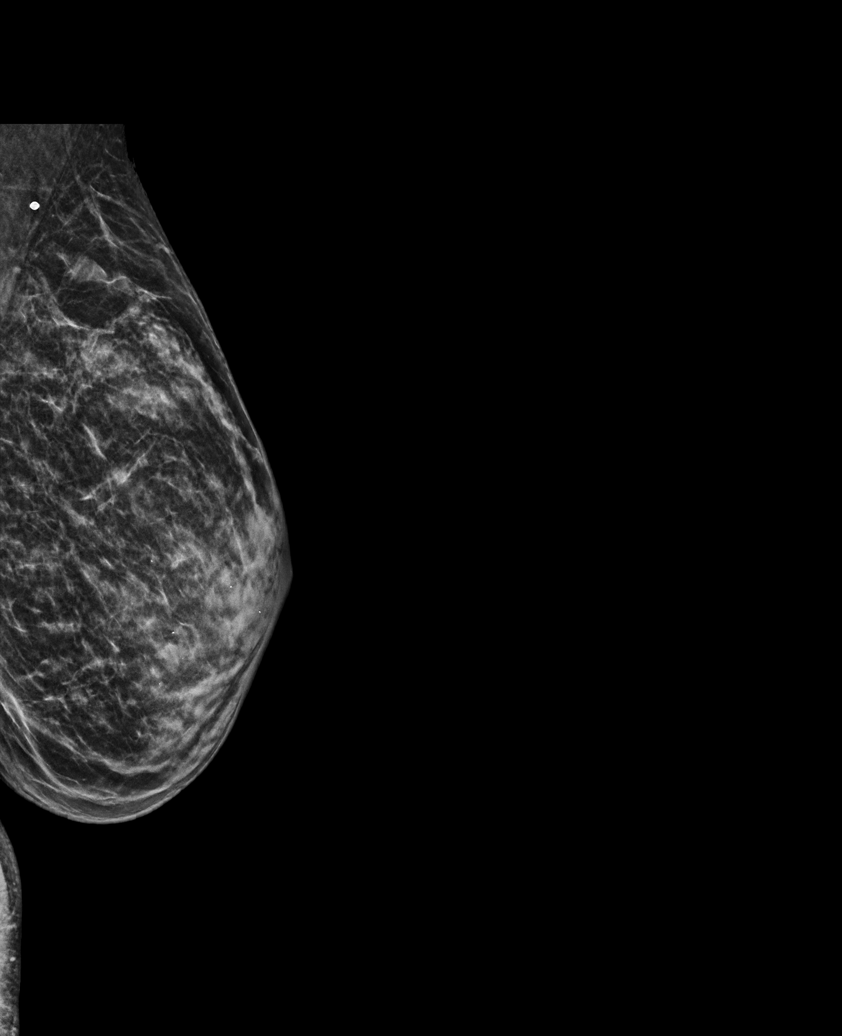

[L MLO synth-2D (2 of 2)]
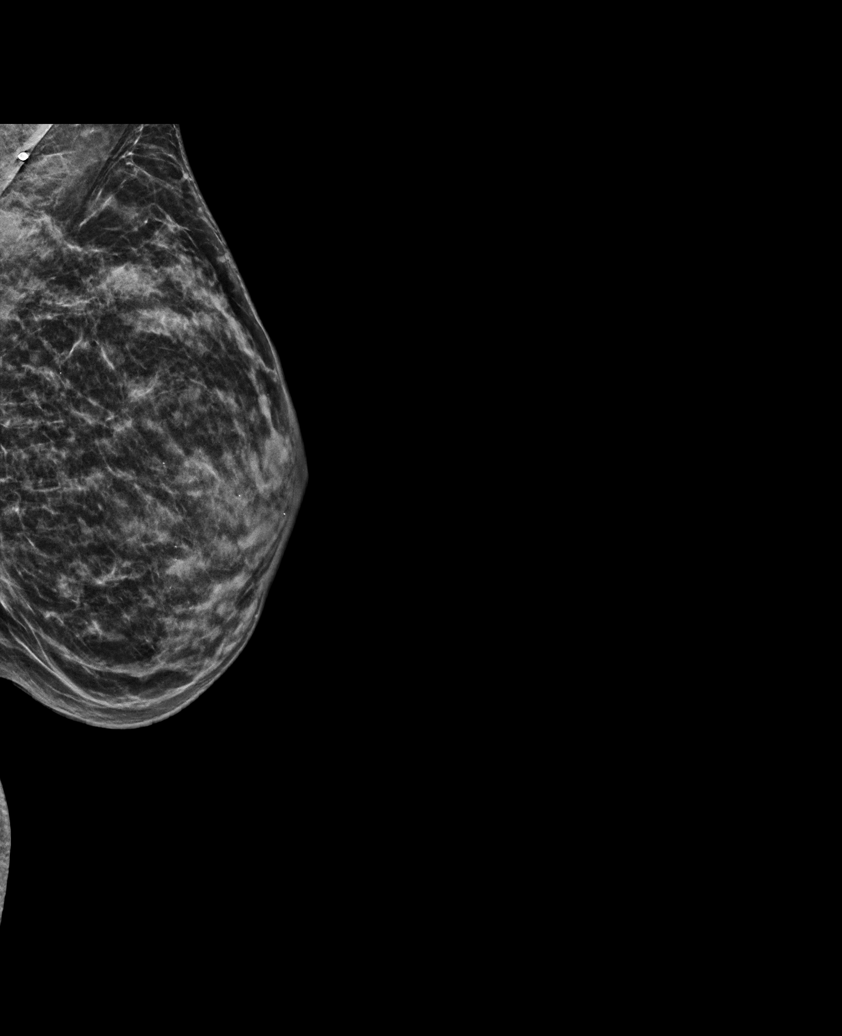

[L CC synth-2D]
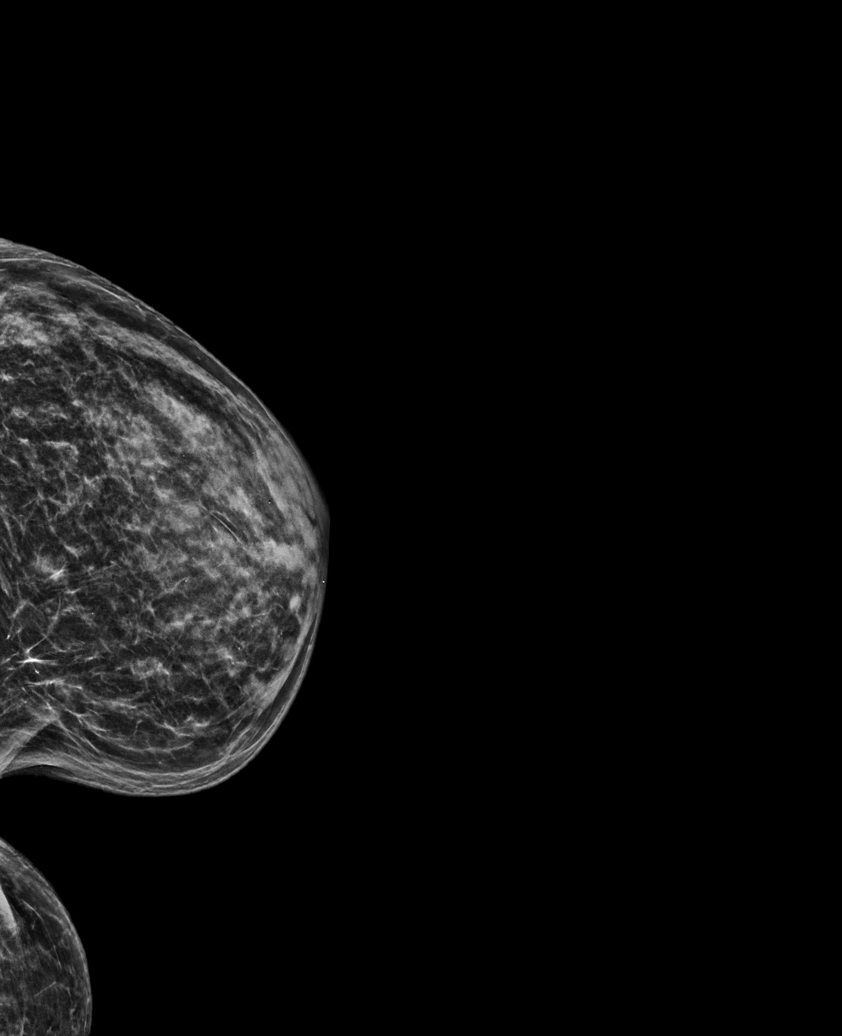

[R MLO synth-2D]
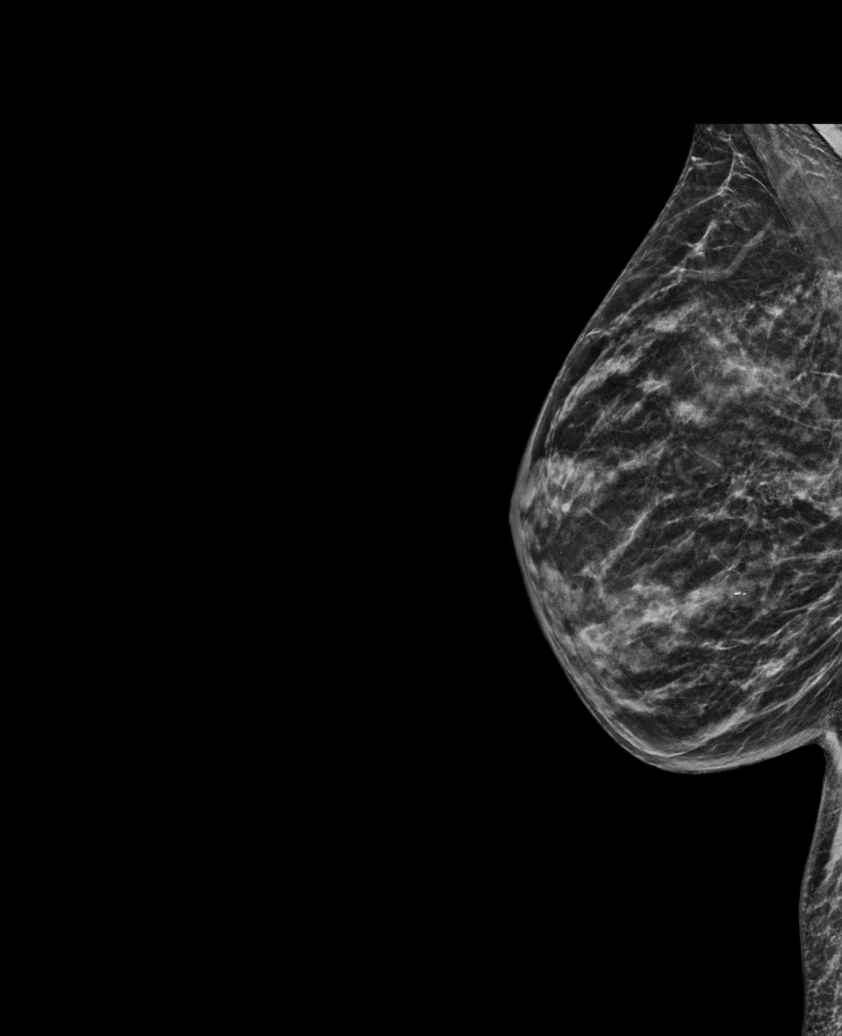

[L TAN synth-2D]
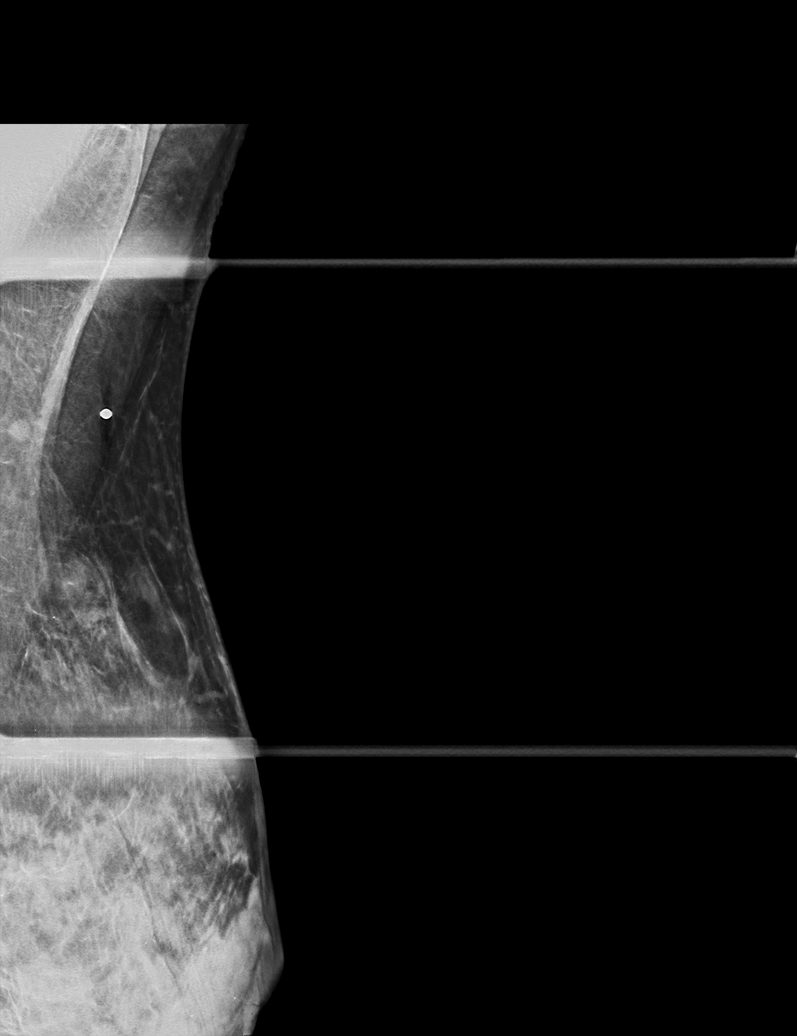

[6 of 36 positions shown; findings below may reference images not displayed]

ACR Breast Density Category c: The breast tissue is heterogeneously
dense, which may obscure small masses.
FINDINGS: No focal or suspicious mammographic findings are identified in
either breast. The parenchymal pattern is stable. A radiopaque BB
was placed at the site of the patient's left axillary lump. A
morphologically normal lymph node is seen deep to the radiopaque BB.
Otherwise no suspicious findings in the left axilla.

Targeted ultrasound is performed, showing a morphologically normal
low lying left axillary lymph node corresponding with the patient's
palpable lump.
IMPRESSION: 1. No mammographic evidence of malignancy in either breast.
2. Benign, morphologically normal lymph node corresponding with the
patient's left axillary palpable lump. No further follow-up
required.

RECOMMENDATION:
Screening mammogram in one year.(Code:[NQ])

I have discussed the findings and recommendations with the patient.
If applicable, a reminder letter will be sent to the patient
regarding the next appointment.

BI-RADS CATEGORY  2: Benign.

## 2022-02-08 DIAGNOSIS — M1712 Unilateral primary osteoarthritis, left knee: Secondary | ICD-10-CM | POA: Diagnosis not present

## 2022-02-09 ENCOUNTER — Other Ambulatory Visit: Payer: Self-pay | Admitting: Orthopedic Surgery

## 2022-02-09 DIAGNOSIS — M25562 Pain in left knee: Secondary | ICD-10-CM

## 2022-02-11 DIAGNOSIS — R7989 Other specified abnormal findings of blood chemistry: Secondary | ICD-10-CM | POA: Diagnosis not present

## 2022-02-11 DIAGNOSIS — R69 Illness, unspecified: Secondary | ICD-10-CM | POA: Diagnosis not present

## 2022-02-19 ENCOUNTER — Ambulatory Visit
Admission: RE | Admit: 2022-02-19 | Discharge: 2022-02-19 | Disposition: A | Discharge status: Home / Self Care | Payer: 59 | Source: Ambulatory Visit | Attending: Orthopedic Surgery | Admitting: Orthopedic Surgery

## 2022-02-19 ENCOUNTER — Other Ambulatory Visit: Payer: Self-pay

## 2022-02-19 DIAGNOSIS — M7989 Other specified soft tissue disorders: Secondary | ICD-10-CM | POA: Diagnosis not present

## 2022-02-19 DIAGNOSIS — M7122 Synovial cyst of popliteal space [Baker], left knee: Secondary | ICD-10-CM | POA: Diagnosis not present

## 2022-02-19 DIAGNOSIS — M23222 Derangement of posterior horn of medial meniscus due to old tear or injury, left knee: Secondary | ICD-10-CM | POA: Diagnosis not present

## 2022-02-19 DIAGNOSIS — M1712 Unilateral primary osteoarthritis, left knee: Secondary | ICD-10-CM | POA: Diagnosis not present

## 2022-02-19 DIAGNOSIS — M25562 Pain in left knee: Secondary | ICD-10-CM

## 2022-03-01 DIAGNOSIS — M228X2 Other disorders of patella, left knee: Secondary | ICD-10-CM | POA: Diagnosis not present

## 2022-03-01 DIAGNOSIS — M1712 Unilateral primary osteoarthritis, left knee: Secondary | ICD-10-CM | POA: Diagnosis not present

## 2022-04-15 DIAGNOSIS — Z01419 Encounter for gynecological examination (general) (routine) without abnormal findings: Secondary | ICD-10-CM | POA: Diagnosis not present

## 2022-04-15 DIAGNOSIS — Z6821 Body mass index (BMI) 21.0-21.9, adult: Secondary | ICD-10-CM | POA: Diagnosis not present

## 2022-04-15 DIAGNOSIS — Z760 Encounter for issue of repeat prescription: Secondary | ICD-10-CM | POA: Diagnosis not present

## 2022-05-02 DIAGNOSIS — M1991 Primary osteoarthritis, unspecified site: Secondary | ICD-10-CM | POA: Diagnosis not present

## 2022-05-02 DIAGNOSIS — Z6821 Body mass index (BMI) 21.0-21.9, adult: Secondary | ICD-10-CM | POA: Diagnosis not present

## 2022-05-02 DIAGNOSIS — M7989 Other specified soft tissue disorders: Secondary | ICD-10-CM | POA: Diagnosis not present

## 2022-05-23 DIAGNOSIS — Z13820 Encounter for screening for osteoporosis: Secondary | ICD-10-CM | POA: Diagnosis not present

## 2022-08-12 ENCOUNTER — Ambulatory Visit
Admission: RE | Admit: 2022-08-12 | Discharge: 2022-08-12 | Disposition: A | Discharge status: Home / Self Care | Payer: 59 | Source: Ambulatory Visit | Attending: Registered Nurse | Admitting: Registered Nurse

## 2022-08-12 ENCOUNTER — Other Ambulatory Visit: Payer: Self-pay | Admitting: Registered Nurse

## 2022-08-12 DIAGNOSIS — R319 Hematuria, unspecified: Secondary | ICD-10-CM | POA: Diagnosis not present

## 2022-08-12 DIAGNOSIS — N200 Calculus of kidney: Secondary | ICD-10-CM | POA: Diagnosis not present

## 2022-08-12 DIAGNOSIS — R300 Dysuria: Secondary | ICD-10-CM

## 2022-08-29 DIAGNOSIS — N200 Calculus of kidney: Secondary | ICD-10-CM | POA: Diagnosis not present

## 2022-08-29 DIAGNOSIS — R3121 Asymptomatic microscopic hematuria: Secondary | ICD-10-CM | POA: Diagnosis not present

## 2022-10-15 DIAGNOSIS — Z23 Encounter for immunization: Secondary | ICD-10-CM | POA: Diagnosis not present

## 2022-11-29 DIAGNOSIS — H10413 Chronic giant papillary conjunctivitis, bilateral: Secondary | ICD-10-CM | POA: Diagnosis not present

## 2022-12-14 ENCOUNTER — Other Ambulatory Visit: Payer: Self-pay | Admitting: Internal Medicine

## 2022-12-14 DIAGNOSIS — Z1231 Encounter for screening mammogram for malignant neoplasm of breast: Secondary | ICD-10-CM

## 2023-02-03 ENCOUNTER — Ambulatory Visit
Admission: RE | Admit: 2023-02-03 | Discharge: 2023-02-03 | Disposition: A | Discharge status: Home / Self Care | Payer: 59 | Source: Ambulatory Visit | Attending: Internal Medicine | Admitting: Internal Medicine

## 2023-02-03 DIAGNOSIS — Z1231 Encounter for screening mammogram for malignant neoplasm of breast: Secondary | ICD-10-CM

## 2023-04-18 DIAGNOSIS — E7841 Elevated Lipoprotein(a): Secondary | ICD-10-CM | POA: Diagnosis not present

## 2023-04-18 DIAGNOSIS — R7989 Other specified abnormal findings of blood chemistry: Secondary | ICD-10-CM | POA: Diagnosis not present

## 2023-04-18 DIAGNOSIS — K219 Gastro-esophageal reflux disease without esophagitis: Secondary | ICD-10-CM | POA: Diagnosis not present

## 2023-05-01 DIAGNOSIS — Z01419 Encounter for gynecological examination (general) (routine) without abnormal findings: Secondary | ICD-10-CM | POA: Diagnosis not present

## 2023-05-01 DIAGNOSIS — Z760 Encounter for issue of repeat prescription: Secondary | ICD-10-CM | POA: Diagnosis not present

## 2023-05-01 DIAGNOSIS — N951 Menopausal and female climacteric states: Secondary | ICD-10-CM | POA: Diagnosis not present

## 2023-05-01 DIAGNOSIS — R895 Abnormal microbiological findings in specimens from other organs, systems and tissues: Secondary | ICD-10-CM | POA: Diagnosis not present

## 2023-05-01 DIAGNOSIS — Z6821 Body mass index (BMI) 21.0-21.9, adult: Secondary | ICD-10-CM | POA: Diagnosis not present

## 2023-05-08 DIAGNOSIS — R82998 Other abnormal findings in urine: Secondary | ICD-10-CM | POA: Diagnosis not present

## 2023-06-28 DIAGNOSIS — L814 Other melanin hyperpigmentation: Secondary | ICD-10-CM | POA: Diagnosis not present

## 2023-06-28 DIAGNOSIS — L821 Other seborrheic keratosis: Secondary | ICD-10-CM | POA: Diagnosis not present

## 2023-06-28 DIAGNOSIS — D225 Melanocytic nevi of trunk: Secondary | ICD-10-CM | POA: Diagnosis not present

## 2023-06-28 DIAGNOSIS — D1801 Hemangioma of skin and subcutaneous tissue: Secondary | ICD-10-CM | POA: Diagnosis not present

## 2023-09-30 DIAGNOSIS — Z23 Encounter for immunization: Secondary | ICD-10-CM | POA: Diagnosis not present

## 2023-10-30 ENCOUNTER — Ambulatory Visit (AMBULATORY_SURGERY_CENTER): Payer: 59 | Admitting: *Deleted

## 2023-10-30 VITALS — Ht 67.0 in | Wt 132.0 lb

## 2023-10-30 DIAGNOSIS — Z860100 Personal history of colon polyps, unspecified: Secondary | ICD-10-CM

## 2023-10-30 DIAGNOSIS — Z800 Family history of malignant neoplasm of digestive organs: Secondary | ICD-10-CM

## 2023-10-30 MED ORDER — NA SULFATE-K SULFATE-MG SULF 17.5-3.13-1.6 GM/177ML PO SOLN: 1.0000 | Freq: Once | ORAL | 0 refills | Status: AC

## 2023-10-30 NOTE — Progress Notes (Signed)
No egg or soy allergy known to patient  No issues known to pt with past sedation with any surgeries or procedures Patient denies ever being told they had issues or difficulty with intubation  No FH of Malignant Hyperthermia Pt is not on diet pills Pt is not on  home 02  Pt is not on blood thinners  Pt denies issues with constipation  No A fib or A flutter Have any cardiac testing pending--no Pt instructed to use Singlecare.com or GoodRx for a price reduction on prep  Patient's chart reviewed by John Nulty CNRA prior to previsit and patient appropriate for the [facility].  Previsit completed and red dot placed by patient's name on their procedure day (on provider's schedule).    

## 2023-11-02 ENCOUNTER — Encounter: Payer: Self-pay | Admitting: Internal Medicine

## 2023-11-20 ENCOUNTER — Encounter: Payer: Self-pay | Admitting: Internal Medicine

## 2023-11-20 ENCOUNTER — Ambulatory Visit (AMBULATORY_SURGERY_CENTER): Payer: 59 | Admitting: Internal Medicine

## 2023-11-20 VITALS — BP 98/47 | HR 64 | Temp 97.6°F | Resp 16 | Ht 67.0 in | Wt 132.0 lb

## 2023-11-20 DIAGNOSIS — Z800 Family history of malignant neoplasm of digestive organs: Secondary | ICD-10-CM | POA: Diagnosis not present

## 2023-11-20 DIAGNOSIS — D123 Benign neoplasm of transverse colon: Secondary | ICD-10-CM

## 2023-11-20 DIAGNOSIS — K635 Polyp of colon: Secondary | ICD-10-CM

## 2023-11-20 DIAGNOSIS — Z1211 Encounter for screening for malignant neoplasm of colon: Secondary | ICD-10-CM | POA: Diagnosis not present

## 2023-11-20 DIAGNOSIS — F419 Anxiety disorder, unspecified: Secondary | ICD-10-CM | POA: Diagnosis not present

## 2023-11-20 DIAGNOSIS — K644 Residual hemorrhoidal skin tags: Secondary | ICD-10-CM

## 2023-11-20 DIAGNOSIS — Z860100 Personal history of colon polyps, unspecified: Secondary | ICD-10-CM

## 2023-11-20 DIAGNOSIS — Z860101 Personal history of adenomatous and serrated colon polyps: Secondary | ICD-10-CM | POA: Diagnosis not present

## 2023-11-20 MED ORDER — SODIUM CHLORIDE 0.9 % IV SOLN: 500.0000 mL | Freq: Once | INTRAVENOUS | Status: DC

## 2023-11-20 NOTE — Progress Notes (Signed)
GASTROENTEROLOGY PROCEDURE H&P NOTE   Primary Care Physician: Garlan Fillers, MD    Reason for Procedure:  History of colon polyps including SSP greater than a centimeter  Plan:    Colonoscopy  Patient is appropriate for endoscopic procedure(s) in the ambulatory ([facility]) setting.  The nature of the procedure, as well as the risks, benefits, and alternatives were carefully and thoroughly reviewed with the patient. Ample time for discussion and questions allowed. The patient understood, was satisfied, and agreed to proceed.     HPI: Margaret Hoffman is a 62 y.o. female who presents for colonoscopy.  Medical history as below.  Tolerated the prep.  No recent chest pain or shortness of breath.  No abdominal pain today.  Past Medical History:  Diagnosis Date   Abnormal uterine bleeding (AUB)    uterine polyps   Anxiety    Arthritis    knees,feet   Wears contact lenses     Past Surgical History:  Procedure Laterality Date   BREAST BIOPSY     left breast/benign   COLONOSCOPY     x 2   DILITATION & CURRETTAGE/HYSTROSCOPY WITH HYDROTHERMAL ABLATION N/A 02/11/2016   Procedure: DILATATION & CURETTAGE/HYSTEROSCOPY ;  Surgeon: Marcelle Overlie, MD;  Location: [facility] [place];  Service: Gynecology;  Laterality: N/A;   HYSTEROSCOPY WITH D & C N/A 09/15/2014   Procedure: DILATATION AND CURETTAGE /HYSTEROSCOPY;  Surgeon: Jeani Hawking, MD;  Location: [facility];  Service: Gynecology;  Laterality: N/A;   endometrial polyp   LAPAROSCOPIC VAGINAL HYSTERECTOMY WITH SALPINGECTOMY Bilateral 07/18/2018   Procedure: LAPAROSCOPIC ASSISTED VAGINAL HYSTERECTOMY WITH SALPINGECTOMY;  Surgeon: Marcelle Overlie, MD;  Location: [facility];  Service: Gynecology;  Laterality: Bilateral;  vagina    Prior to Admission medications   Medication Sig Start Date End Date Taking? Authorizing Provider  ALPRAZolam Prudy Feeler) 0.5 MG tablet Take 0.25 mg by mouth at bedtime  as needed for sleep.    Yes [provider]  Biotin 10 MG TABS Take by mouth daily.   Yes [provider]  cholecalciferol (VITAMIN D3) 25 MCG (1000 UT) tablet Take 1,000 Units by mouth daily.   Yes [provider]  estradiol (VIVELLE-DOT) 0.0375 MG/24HR Place 1 patch onto the skin 2 (two) times a week.   Yes [provider]  RETIN-A 0.025 % cream Apply topically at bedtime. 09/28/23  Yes [provider]  sertraline (ZOLOFT) 100 MG tablet Take 100 mg by mouth daily.   Yes [provider]  conjugated estrogens (PREMARIN) vaginal cream as needed. Patient not taking: Reported on 10/30/2023 04/15/22   [provider]  triamcinolone cream (KENALOG) 0.1 % as needed. Patient not taking: Reported on 10/30/2023 12/28/21   [provider]    Current Outpatient Medications  Medication Sig Dispense Refill   ALPRAZolam (XANAX) 0.5 MG tablet Take 0.25 mg by mouth at bedtime as needed for sleep.      Biotin 10 MG TABS Take by mouth daily.     cholecalciferol (VITAMIN D3) 25 MCG (1000 UT) tablet Take 1,000 Units by mouth daily.     estradiol (VIVELLE-DOT) 0.0375 MG/24HR Place 1 patch onto the skin 2 (two) times a week.     RETIN-A 0.025 % cream Apply topically at bedtime.     sertraline (ZOLOFT) 100 MG tablet Take 100 mg by mouth daily.     conjugated estrogens (PREMARIN) vaginal cream as needed. (Patient not taking: Reported on 10/30/2023)  triamcinolone cream (KENALOG) 0.1 % as needed. (Patient not taking: Reported on 10/30/2023)     Current Facility-Administered Medications  Medication Dose Route Frequency Provider Last Rate Last Admin   0.9 %  sodium chloride infusion  500 mL Intravenous Once Mosie Angus, Carie Caddy, MD        Allergies as of 11/20/2023 - Review Complete 11/20/2023  Allergen Reaction Noted   Codeine Nausea And Vomiting 09/10/2014    Family History  Problem Relation Age of Onset   Other Mother        primary  sclerosing cholangitis   Healthy Sister    Colon polyps Maternal Grandmother    Colon cancer Maternal Grandmother        age 45   Leukemia Maternal Grandmother    Breast cancer Neg Hx    Esophageal cancer Neg Hx    Pancreatic cancer Neg Hx    Crohn's disease Neg Hx    Rectal cancer Neg Hx    Stomach cancer Neg Hx    Ulcerative colitis Neg Hx     Social History   Socioeconomic History   Marital status: Married    Spouse name: Not on file   Number of children: Not on file   Years of education: Not on file   Highest education level: Not on file  Occupational History   Not on file  Tobacco Use   Smoking status: Never   Smokeless tobacco: Never  Vaping Use   Vaping status: Never Used  Substance and Sexual Activity   Alcohol use: Yes    Comment: rare   Drug use: No   Sexual activity: Not on file  Other Topics Concern   Not on file  Social History Narrative   Not on file   Social Determinants of Health   Financial Resource Strain: Not on file  Food Insecurity: Not on file  Transportation Needs: Not on file  Physical Activity: Not on file  Stress: Not on file  Social Connections: Not on file  Intimate Partner Violence: Not on file    Physical Exam: Vital signs in last 24 hours: @BP  127/78   Pulse 91   Temp 97.6 F (36.4 C)   Resp 12   Ht 5\' 7"  (1.702 m)   Wt 132 lb (59.9 kg)   LMP 12/25/2012   SpO2 100%   BMI 20.67 kg/m  GEN: NAD EYE: Sclerae anicteric ENT: MMM CV: Non-tachycardic Pulm: CTA b/l GI: Soft, NT/ND NEURO:  Alert & Oriented x 3   Erick Blinks, MD [facility]  11/20/2023 8:04 AM

## 2023-11-20 NOTE — Op Note (Signed)
[facility] Patient Name: Margaret Hoffman Procedure Date: 11/20/2023 7:59 AM MRN: [id number] Endoscopist: Beverley Fiedler , MD, [id number] Age: 62 Referring MD:  Date of Birth: 04/17/1961 Gender: Female Account #: [id number] Procedure:                Colonoscopy Indications:              High risk colon cancer surveillance: Personal                            history of sessile serrated colon polyp (10 mm or                            greater in size), Last colonoscopy: December 2019                            (SSP x 3, one 10 mm; TA x 1); Dec 2013 (TA x 1); FH                            of colon cancer in one 2nd degree relative                            (grandmother) Medicines:                Monitored Anesthesia Care Procedure:                Pre-Anesthesia Assessment:                           - Prior to the procedure, a History and Physical                            was performed, and patient medications and                            allergies were reviewed. The patient's tolerance of                            previous anesthesia was also reviewed. The risks                            and benefits of the procedure and the sedation                            options and risks were discussed with the patient.                            All questions were answered, and informed consent                            was obtained. Prior Anticoagulants: The patient has                            taken no anticoagulant or antiplatelet agents. ASA  Grade Assessment: II - A patient with mild systemic                            disease. After reviewing the risks and benefits,                            the patient was deemed in satisfactory condition to                            undergo the procedure.                           After obtaining informed consent, the colonoscope                            was passed under direct vision. Throughout the                             procedure, the patient's blood pressure, pulse, and                            oxygen saturations were monitored continuously. The                            Olympus Scope [id number] was introduced through the                            anus and advanced to the cecum, identified by                            appendiceal orifice and ileocecal valve. The                            colonoscopy was performed without difficulty. The                            patient tolerated the procedure well. The quality                            of the bowel preparation was excellent. The                            ileocecal valve, appendiceal orifice, and rectum                            were photographed. Scope In: 8:10:12 AM Scope Out: 8:25:44 AM Scope Withdrawal Time: 0 hours 11 minutes 21 seconds  Total Procedure Duration: 0 hours 15 minutes 32 seconds  Findings:                 The digital rectal exam was normal.                           A 5 mm polyp was found in the transverse colon. The  polyp was sessile. The polyp was removed with a                            cold snare. Resection and retrieval were complete.                           Internal hemorrhoids were found during                            retroflexion. The hemorrhoids were small.                           The exam was otherwise without abnormality. Complications:            No immediate complications. Estimated Blood Loss:     Estimated blood loss: none. Impression:               - One 5 mm polyp in the transverse colon, removed                            with a cold snare. Resected and retrieved.                           - Small internal hemorrhoids.                           - The examination was otherwise normal. Recommendation:           - Patient has a contact number available for                            emergencies. The signs and symptoms of potential                             delayed complications were discussed with the                            patient. Return to normal activities tomorrow.                            Written discharge instructions were provided to the                            patient.                           - Resume previous diet.                           - Continue present medications.                           - Await pathology results.                           - Repeat colonoscopy in 5 years for surveillance. Beverley Fiedler, MD 11/20/2023 8:35:56 AM This report  has been signed electronically.

## 2023-11-20 NOTE — Progress Notes (Signed)
Vss nad trans to pacu 

## 2023-11-20 NOTE — Progress Notes (Signed)
Called to room to assist during endoscopic procedure.  Patient ID and intended procedure confirmed with present staff. Received instructions for my participation in the procedure from the performing physician.  

## 2023-11-20 NOTE — Patient Instructions (Signed)
 Thank you for letting us take care of your healthcare needs today! Please see handouts regarding polyps and hemorrhoids.   YOU HAD AN ENDOSCOPIC PROCEDURE TODAY AT THE [facility]:   Refer to the procedure report that was given to you for any specific questions about what was found during the examination.  If the procedure report does not answer your questions, please call your gastroenterologist to clarify.  If you requested that your care partner not be given the details of your procedure findings, then the procedure report has been included in a sealed envelope for you to review at your convenience later.  YOU SHOULD EXPECT: Some feelings of bloating in the abdomen. Passage of more gas than usual.  Walking can help get rid of the air that was put into your GI tract during the procedure and reduce the bloating. If you had a lower endoscopy (such as a colonoscopy or flexible sigmoidoscopy) you may notice spotting of blood in your stool or on the toilet paper. If you underwent a bowel prep for your procedure, you may not have a normal bowel movement for a few days.  Please Note:  You might notice some irritation and congestion in your nose or some drainage.  This is from the oxygen used during your procedure.  There is no need for concern and it should clear up in a day or so.  SYMPTOMS TO REPORT IMMEDIATELY:  Following lower endoscopy (colonoscopy or flexible sigmoidoscopy):  Excessive amounts of blood in the stool  Significant tenderness or worsening of abdominal pains  Swelling of the abdomen that is new, acute  Fever of 100F or higher  For urgent or emergent issues, a gastroenterologist can be reached at any hour by calling [phone]. Do not use MyChart messaging for urgent concerns.    DIET:  We do recommend a small meal at first, but then you may proceed to your regular diet.  Drink plenty of fluids but you should avoid alcoholic beverages for 24 hours.  ACTIVITY:  You  should plan to take it easy for the rest of today and you should NOT DRIVE or use heavy machinery until tomorrow (because of the sedation medicines used during the test).    FOLLOW UP: Our staff will call the number listed on your records the next business day following your procedure.  We will call around 7:15- 8:00 am to check on you and address any questions or concerns that you may have regarding the information given to you following your procedure. If we do not reach you, we will leave a message.     If any biopsies were taken you will be contacted by phone or by letter within the next 1-3 weeks.  Please call us at [phone] if you have not heard about the biopsies in 3 weeks.    SIGNATURES/CONFIDENTIALITY: You and/or your care partner have signed paperwork which will be entered into your electronic medical record.  These signatures attest to the fact that that the information above on your After Visit Summary has been reviewed and is understood.  Full responsibility of the confidentiality of this discharge information lies with you and/or your care-partner.

## 2023-11-20 NOTE — Progress Notes (Signed)
Pt's states no medical or surgical changes since previsit or office visit. 

## 2023-11-21 ENCOUNTER — Telehealth: Payer: Self-pay

## 2023-11-21 NOTE — Telephone Encounter (Signed)
  Follow up Call-     11/20/2023    7:23 AM  Call back number  Post procedure Call Back phone  # [phone]  Permission to leave phone message Yes     Patient questions:  Do you have a fever, pain , or abdominal swelling? No. Pain Score  0 *  Have you tolerated food without any problems? Yes.    Have you been able to return to your normal activities? Yes.    Do you have any questions about your discharge instructions: Diet   No. Medications  No. Follow up visit  No.  Do you have questions or concerns about your Care? No.  Actions: * If pain score is 4 or above: No action needed, pain <4.

## 2023-11-22 LAB — SURGICAL PATHOLOGY

## 2023-11-24 ENCOUNTER — Encounter: Payer: Self-pay | Admitting: Internal Medicine

## 2023-12-11 DIAGNOSIS — H524 Presbyopia: Secondary | ICD-10-CM | POA: Diagnosis not present

## 2023-12-11 DIAGNOSIS — H43811 Vitreous degeneration, right eye: Secondary | ICD-10-CM | POA: Diagnosis not present

## 2023-12-11 DIAGNOSIS — H52223 Regular astigmatism, bilateral: Secondary | ICD-10-CM | POA: Diagnosis not present

## 2023-12-11 DIAGNOSIS — H5203 Hypermetropia, bilateral: Secondary | ICD-10-CM | POA: Diagnosis not present

## 2023-12-11 DIAGNOSIS — H5231 Anisometropia: Secondary | ICD-10-CM | POA: Diagnosis not present
